# Patient Record
Sex: Female | Born: 1965
Health system: Southern US, Community
[De-identification: ages and names within clinical notes are randomized; demographics above are authoritative.]

## PROBLEM LIST (undated history)

## (undated) DIAGNOSIS — L309 Dermatitis, unspecified: Secondary | ICD-10-CM

## (undated) DIAGNOSIS — I10 Essential (primary) hypertension: Secondary | ICD-10-CM

## (undated) DIAGNOSIS — E0590 Thyrotoxicosis, unspecified without thyrotoxic crisis or storm: Secondary | ICD-10-CM

## (undated) HISTORY — DX: Essential (primary) hypertension: I10

## (undated) HISTORY — DX: Thyrotoxicosis, unspecified without thyrotoxic crisis or storm: E05.90

## (undated) HISTORY — PX: CARDIAC CATHETERIZATION: SHX172

## (undated) HISTORY — DX: Dermatitis, unspecified: L30.9

---

## 1997-09-12 ENCOUNTER — Encounter: Admission: RE | Admit: 1997-09-12 | Discharge: 1997-09-12 | Payer: Self-pay | Admitting: Family Medicine

## 1998-02-08 ENCOUNTER — Encounter: Admission: RE | Admit: 1998-02-08 | Discharge: 1998-02-08 | Payer: Self-pay | Admitting: Family Medicine

## 1999-04-08 ENCOUNTER — Emergency Department (HOSPITAL_COMMUNITY): Admission: EM | Admit: 1999-04-08 | Discharge: 1999-04-08 | Payer: Self-pay | Admitting: Emergency Medicine

## 1999-04-08 ENCOUNTER — Encounter: Payer: Self-pay | Admitting: Emergency Medicine

## 1999-05-02 ENCOUNTER — Encounter: Admission: RE | Admit: 1999-05-02 | Discharge: 1999-05-02 | Payer: Self-pay | Admitting: Family Medicine

## 1999-08-31 ENCOUNTER — Encounter: Payer: Self-pay | Admitting: Emergency Medicine

## 1999-08-31 ENCOUNTER — Emergency Department (HOSPITAL_COMMUNITY): Admission: EM | Admit: 1999-08-31 | Discharge: 1999-08-31 | Payer: Self-pay | Admitting: Emergency Medicine

## 2001-02-04 ENCOUNTER — Emergency Department (HOSPITAL_COMMUNITY): Admission: EM | Admit: 2001-02-04 | Discharge: 2001-02-04 | Payer: Self-pay | Admitting: Emergency Medicine

## 2001-06-17 ENCOUNTER — Emergency Department (HOSPITAL_COMMUNITY): Admission: AC | Admit: 2001-06-17 | Discharge: 2001-06-17 | Payer: Self-pay

## 2001-06-17 ENCOUNTER — Encounter: Payer: Self-pay | Admitting: *Deleted

## 2001-09-16 ENCOUNTER — Encounter: Admission: RE | Admit: 2001-09-16 | Discharge: 2001-09-16 | Payer: Self-pay | Admitting: Family Medicine

## 2001-09-30 ENCOUNTER — Encounter: Admission: RE | Admit: 2001-09-30 | Discharge: 2001-09-30 | Payer: Self-pay | Admitting: Family Medicine

## 2002-05-31 ENCOUNTER — Encounter: Admission: RE | Admit: 2002-05-31 | Discharge: 2002-05-31 | Payer: Self-pay | Admitting: Sports Medicine

## 2003-03-02 ENCOUNTER — Encounter: Admission: RE | Admit: 2003-03-02 | Discharge: 2003-03-02 | Payer: Self-pay | Admitting: Family Medicine

## 2004-04-10 ENCOUNTER — Ambulatory Visit: Payer: Self-pay | Admitting: Family Medicine

## 2004-04-18 ENCOUNTER — Emergency Department (HOSPITAL_COMMUNITY): Admission: EM | Admit: 2004-04-18 | Discharge: 2004-04-18 | Payer: Self-pay | Admitting: *Deleted

## 2005-01-02 ENCOUNTER — Inpatient Hospital Stay (HOSPITAL_COMMUNITY): Admission: EM | Admit: 2005-01-02 | Discharge: 2005-01-10 | Payer: Self-pay | Admitting: Emergency Medicine

## 2005-01-02 ENCOUNTER — Ambulatory Visit: Payer: Self-pay | Admitting: Family Medicine

## 2005-01-02 ENCOUNTER — Ambulatory Visit: Payer: Self-pay | Admitting: Cardiology

## 2005-01-04 ENCOUNTER — Ambulatory Visit: Payer: Self-pay | Admitting: Internal Medicine

## 2005-01-24 ENCOUNTER — Ambulatory Visit: Payer: Self-pay | Admitting: Sports Medicine

## 2005-01-25 ENCOUNTER — Ambulatory Visit: Payer: Self-pay | Admitting: Internal Medicine

## 2006-07-31 DIAGNOSIS — L309 Dermatitis, unspecified: Secondary | ICD-10-CM | POA: Insufficient documentation

## 2006-07-31 DIAGNOSIS — E0590 Thyrotoxicosis, unspecified without thyrotoxic crisis or storm: Secondary | ICD-10-CM | POA: Insufficient documentation

## 2006-07-31 DIAGNOSIS — I10 Essential (primary) hypertension: Secondary | ICD-10-CM | POA: Insufficient documentation

## 2006-07-31 DIAGNOSIS — F17200 Nicotine dependence, unspecified, uncomplicated: Secondary | ICD-10-CM

## 2007-07-30 ENCOUNTER — Encounter: Payer: Self-pay | Admitting: Family Medicine

## 2007-07-30 ENCOUNTER — Ambulatory Visit: Payer: Self-pay | Admitting: Family Medicine

## 2007-07-30 DIAGNOSIS — N912 Amenorrhea, unspecified: Secondary | ICD-10-CM | POA: Insufficient documentation

## 2007-07-30 LAB — CONVERTED CEMR LAB
BUN: 15 mg/dL (ref 6–23)
Beta hcg, urine, semiquantitative: NEGATIVE
CO2: 21 meq/L (ref 19–32)
Calcium: 8.9 mg/dL (ref 8.4–10.5)
Chlamydia, DNA Probe: NEGATIVE
Chloride: 108 meq/L (ref 96–112)
Creatinine, Ser: 0.7 mg/dL (ref 0.40–1.20)
GC Probe Amp, Genital: NEGATIVE
Glucose, Bld: 94 mg/dL (ref 70–99)
Potassium: 4.4 meq/L (ref 3.5–5.3)
Sodium: 141 meq/L (ref 135–145)
TSH: 0.018 microintl units/mL — ABNORMAL LOW (ref 0.350–5.50)

## 2007-08-04 ENCOUNTER — Encounter: Payer: Self-pay | Admitting: Family Medicine

## 2007-08-06 ENCOUNTER — Telehealth: Payer: Self-pay | Admitting: Family Medicine

## 2008-01-18 ENCOUNTER — Encounter (INDEPENDENT_AMBULATORY_CARE_PROVIDER_SITE_OTHER): Payer: Self-pay | Admitting: Family Medicine

## 2008-01-18 ENCOUNTER — Encounter: Payer: Self-pay | Admitting: *Deleted

## 2008-01-18 ENCOUNTER — Ambulatory Visit: Payer: Self-pay | Admitting: Family Medicine

## 2008-03-04 ENCOUNTER — Telehealth: Payer: Self-pay | Admitting: *Deleted

## 2008-12-09 ENCOUNTER — Ambulatory Visit: Payer: Self-pay | Admitting: Family Medicine

## 2010-06-24 ENCOUNTER — Encounter: Payer: Self-pay | Admitting: Family Medicine

## 2010-06-24 ENCOUNTER — Encounter: Payer: Self-pay | Admitting: Internal Medicine

## 2010-07-13 ENCOUNTER — Telehealth: Payer: Self-pay | Admitting: Family Medicine

## 2010-07-13 NOTE — Telephone Encounter (Signed)
Received a refill request for Propylthiouracl and Metoprol.  Both denied - pt needs to make an appt.

## 2010-07-13 NOTE — Telephone Encounter (Signed)
Spoke with Candice Evans, pt was informed to call the pharmacy and ask them to send a refill request.  Pt just wants the let the MD know that she understands that she has not been here in a while, she just wants the MD to give her a weeks medicine when he receives the refill request.  Will forward to MD and Myrlene Broker, RN who does intern refills.

## 2010-07-16 ENCOUNTER — Telehealth: Payer: Self-pay | Admitting: Family Medicine

## 2010-07-17 NOTE — Telephone Encounter (Signed)
resolved 

## 2010-07-17 NOTE — Telephone Encounter (Signed)
Message left for patient to call back. It appears that she has had no refills on propylthiouricil  or metorprol since 12/2009. consulted Dr. Leveda Anna  And he advises if she has been off propylthiouracil for a while it will be best to wait until office visit and check labs before restarting. May send in metoprolol.  Will discuss with patient when she calls back.

## 2010-07-17 NOTE — Telephone Encounter (Signed)
Will refill for two weeks, pt has appointment in a few days.  Needs to keep appointment before given full refills.

## 2010-07-19 NOTE — Telephone Encounter (Signed)
Spoke with patient . She had not recieved any messages that I had left for her.  She has appointment tomorrow with MD. She has been out of both meds for about 6 months. Advised I can send in metoprolol but she states she will wait until  tomorrow.

## 2010-07-20 ENCOUNTER — Other Ambulatory Visit: Payer: Self-pay | Admitting: Family Medicine

## 2010-07-20 ENCOUNTER — Ambulatory Visit (INDEPENDENT_AMBULATORY_CARE_PROVIDER_SITE_OTHER): Payer: Self-pay | Admitting: Family Medicine

## 2010-07-20 ENCOUNTER — Encounter: Payer: Self-pay | Admitting: Family Medicine

## 2010-07-20 VITALS — BP 210/80 | HR 84 | Wt 150.9 lb

## 2010-07-20 DIAGNOSIS — I10 Essential (primary) hypertension: Secondary | ICD-10-CM

## 2010-07-20 DIAGNOSIS — L2089 Other atopic dermatitis: Secondary | ICD-10-CM

## 2010-07-20 DIAGNOSIS — E0590 Thyrotoxicosis, unspecified without thyrotoxic crisis or storm: Secondary | ICD-10-CM

## 2010-07-20 LAB — COMPREHENSIVE METABOLIC PANEL
Sodium: 137 mEq/L (ref 135–145)
Total Bilirubin: 0.2 mg/dL — ABNORMAL LOW (ref 0.3–1.2)

## 2010-07-20 LAB — CBC
Hemoglobin: 13.7 g/dL (ref 12.0–15.0)
MCH: 32.2 pg (ref 26.0–34.0)
MCV: 92.3 fL (ref 78.0–100.0)
Platelets: 259 10*3/uL (ref 150–400)
RBC: 4.26 MIL/uL (ref 3.87–5.11)
WBC: 9.8 10*3/uL (ref 4.0–10.5)

## 2010-07-20 LAB — TSH: TSH: 0.008 u[IU]/mL — ABNORMAL LOW (ref 0.350–4.500)

## 2010-07-20 MED ORDER — METOPROLOL TARTRATE 25 MG PO TABS: 25.0000 mg | ORAL_TABLET | Freq: Two times a day (BID) | ORAL | Status: DC

## 2010-07-20 MED ORDER — TRIAMCINOLONE ACETONIDE 0.1 % EX CREA: TOPICAL_CREAM | Freq: Two times a day (BID) | CUTANEOUS | Status: DC

## 2010-07-20 MED ORDER — MOMETASONE FUROATE 0.1 % EX OINT: TOPICAL_OINTMENT | Freq: Every day | CUTANEOUS | Status: DC

## 2010-07-20 NOTE — Patient Instructions (Signed)
It was nice meeting you today.  I would like to see you back next week so that we can go over your lab work and come up with a plan of action based on your results.  I also would like to follow-up on your blood pressure at that time.  I will send over a prescription for your metoprolol for now to [facility].

## 2010-07-20 NOTE — Progress Notes (Signed)
  Subjective:    Patient ID: Candice Evans, female    DOB: 1966/03/19, 45 y.o.   MRN: [id number]  HPI Patient here for medication refill.  Has been >1 year since being seen in this clinic.   HTN Has been out of medications x6 months.  Has not really noticed that her blood pressure has been elevated.  Does not check at home. Still smoking 0.5-0.75 packs of cigs per day.  Denies any red flag symptoms including headache, dizziness, blurred vision  Hyperthyroidism Has been out of PTU x 6 months as well.  Has been symptomatic with this.  States she has occasional neck pain, intolerance to heat, difficulty with sleep, increased eye bulging, increased fatigue feeling, and occasional palitations.  She denies diarrhea or difficulty with swallowing.  States she has had work-up for her hyperthyroidism in the past and was considered for radioactive iodine, but it was decided at that time she would go on PTU  Review of Systems Pertinent positives and negatives noted in HPI, Vitals signs noted      Objective:   Physical Exam  Constitutional: She appears well-developed and well-nourished. No distress.  HENT:  Head: Normocephalic and atraumatic.  Eyes: Pupils are equal, round, and reactive to light.  Neck: Thyromegaly present.       Fullness on R side of thyroid  Cardiovascular: Normal rate and regular rhythm.  Exam reveals no gallop and no friction rub.   No murmur heard. Pulmonary/Chest: Breath sounds normal.  Abdominal: Bowel sounds are normal. She exhibits no distension. There is no tenderness.  Musculoskeletal: She exhibits edema.       1+ edema   Neurological:  Reflex Scores:      Bicep reflexes are 3+ on the right side and 3+ on the left side.      Patellar reflexes are 3+ on the right side and 3+ on the left side.

## 2010-07-25 ENCOUNTER — Encounter: Payer: Self-pay | Admitting: Family Medicine

## 2010-07-25 NOTE — Assessment & Plan Note (Addendum)
Out of medications x6 months, will check TSH today to see if she is still truly hyperthyroid.  Will have back in 1 week to go over labs and come up with treatment plan.  Precepted with Dr. Leveda Anna and Moreno-Coll

## 2010-07-25 NOTE — Assessment & Plan Note (Signed)
BP very elevated today, will restart Beta blocker and see her response to this.  Do not want to drop her bp too fast as she has probably been elevated for quite some time.  WIll add other agent back on as needed on return visit.  Return in 1 week for follow-up

## 2010-07-30 ENCOUNTER — Telehealth: Payer: Self-pay | Admitting: Family Medicine

## 2010-07-30 NOTE — Telephone Encounter (Signed)
Pt has been waiting on refill for bp meds, is having blurred vision. Will route to triage.

## 2010-07-30 NOTE — Telephone Encounter (Signed)
Discussed labs. Needs to be seen asap. She had not made a 1 wk f/u appt with pcp because "he did not have anything open" told her to call & make appt. May see anyone. Thyroid is very low & needs bp check asap.Faustino Congress

## 2010-07-31 ENCOUNTER — Ambulatory Visit (INDEPENDENT_AMBULATORY_CARE_PROVIDER_SITE_OTHER): Payer: Self-pay | Admitting: Family Medicine

## 2010-07-31 ENCOUNTER — Encounter: Payer: Self-pay | Admitting: Family Medicine

## 2010-07-31 VITALS — BP 183/89 | HR 79 | Wt 149.0 lb

## 2010-07-31 DIAGNOSIS — L2089 Other atopic dermatitis: Secondary | ICD-10-CM

## 2010-07-31 DIAGNOSIS — E0590 Thyrotoxicosis, unspecified without thyrotoxic crisis or storm: Secondary | ICD-10-CM

## 2010-07-31 DIAGNOSIS — I10 Essential (primary) hypertension: Secondary | ICD-10-CM

## 2010-07-31 DIAGNOSIS — H539 Unspecified visual disturbance: Secondary | ICD-10-CM

## 2010-07-31 MED ORDER — MOMETASONE FUROATE 0.1 % EX OINT: TOPICAL_OINTMENT | Freq: Every day | CUTANEOUS | Status: DC

## 2010-07-31 MED ORDER — METOPROLOL TARTRATE 25 MG PO TABS: 50.0000 mg | ORAL_TABLET | Freq: Two times a day (BID) | ORAL | Status: DC

## 2010-07-31 MED ORDER — PROPYLTHIOURACIL 50 MG PO TABS: ORAL_TABLET | ORAL | Status: DC

## 2010-07-31 MED ORDER — TRIAMCINOLONE ACETONIDE 0.1 % EX CREA: TOPICAL_CREAM | Freq: Two times a day (BID) | CUTANEOUS | Status: DC

## 2010-07-31 NOTE — Patient Instructions (Addendum)
Increase your Metoprolol to 50mg  by mouth twice a day Start the PTU you will take 150mg  twice a day You have been given the [facility] handout Return for labs in 4 weeks Make a follow-up appt with Dr. Ashley Royalty in 4 weeks

## 2010-07-31 NOTE — Assessment & Plan Note (Signed)
Chronic vision changes, should be evaluated by Optho but pt unable to secondary to no insurance No HA currently and no papilledema seen, though proptosis secondary to thyroid disease and needs to be evaluated HTN could also be causing her vision changes.

## 2010-07-31 NOTE — Progress Notes (Signed)
  Subjective:    Patient ID: Candice Evans, female    DOB: [birth year], 45 y.o.   MRN: [id number]  HPI  Here to follow up her labs- reviewed   Vision changes- has had vision changes for many years, sees spots, light changes, swirls    occ has blurry vision, HA occ when this occurs in frontal region, typically resolves without OTC meds, feels this occurs when her blood pressure is high,  no menstrual cycle   Currently taking Metoprolol only for HTN,  Note pt is uninsured- reason why she does not stay on her medication is because she can not afford it, also states she has not had an u/s of the neck and declined radioactive treatment when initially diagnosed   Review of Systems per HPI     Objective:   Physical Exam GEN- NAD, alert and oriented HEENT- proptosis, fundoscopic exam - no papilledema, vessels visualized, PERRL, EOMI Neck- Thyromegaly, RUL nodules, non tender CVS- RRR, no murmur HR 80, pulse 2+ EXT-no edema       Assessment & Plan:

## 2010-07-31 NOTE — Assessment & Plan Note (Signed)
Increase Metoprolol to 50mg  BID Recheck next visit

## 2010-07-31 NOTE — Assessment & Plan Note (Signed)
Discussed symptoms of hyperthryoid and importance of regular follow-up with pt Will restart PTU ( 300mg  total- titrate as needed ) as pt unable to afford Tapazole, will need an u/s of thyroid and radiouptake scan, pt wants to defer until she gets some insurance coverage RTC 4 weeks for repeat labs T3,T4, TSH Beta blocker

## 2010-08-27 ENCOUNTER — Other Ambulatory Visit: Payer: Self-pay

## 2010-08-29 ENCOUNTER — Ambulatory Visit (INDEPENDENT_AMBULATORY_CARE_PROVIDER_SITE_OTHER): Payer: Self-pay | Admitting: Family Medicine

## 2010-08-29 ENCOUNTER — Encounter: Payer: Self-pay | Admitting: Family Medicine

## 2010-08-29 VITALS — BP 170/91 | HR 62 | Temp 98.4°F | Ht 62.0 in | Wt 152.0 lb

## 2010-08-29 DIAGNOSIS — I10 Essential (primary) hypertension: Secondary | ICD-10-CM

## 2010-08-29 DIAGNOSIS — L2089 Other atopic dermatitis: Secondary | ICD-10-CM

## 2010-08-29 DIAGNOSIS — E0590 Thyrotoxicosis, unspecified without thyrotoxic crisis or storm: Secondary | ICD-10-CM

## 2010-08-29 MED ORDER — PROPYLTHIOURACIL 50 MG PO TABS: ORAL_TABLET | ORAL | Status: DC

## 2010-08-29 MED ORDER — TRIAMCINOLONE ACETONIDE 0.1 % EX CREA: TOPICAL_CREAM | Freq: Two times a day (BID) | CUTANEOUS | Status: DC

## 2010-08-29 MED ORDER — HYDROCHLOROTHIAZIDE 12.5 MG PO CAPS: 12.5000 mg | ORAL_CAPSULE | Freq: Every day | ORAL | Status: DC

## 2010-08-29 NOTE — Patient Instructions (Signed)
It was nice seeing you today.  I will call you with results of your labs and let you know if we need to adjust your medications.  I would like to see you back in 2-3 weeks to see how your blood pressure is doing on the new medication.  If you have questions please call our office.

## 2010-08-30 LAB — T4, FREE: Free T4: 0.99 ng/dL (ref 0.80–1.80)

## 2010-09-03 ENCOUNTER — Telehealth: Payer: Self-pay | Admitting: Family Medicine

## 2010-09-03 NOTE — Telephone Encounter (Signed)
Called patient to let her know results of latest thyroid tests.  Feels good.  Plans to follow up in a couple weeks.  Working on getting orange card so can have thyroid scan/us

## 2010-09-04 NOTE — Assessment & Plan Note (Addendum)
Discussed symptoms of hyperthryoid and importance of regular follow-up  Continue PTU at current dose as symptoms resolved at this time.  Repeat labs T3,T4, TSH Cont. Metoprolol Still in the process of getting orange card, will get thyroid uptake scan and U/S once she has this.

## 2010-09-04 NOTE — Assessment & Plan Note (Signed)
Has flares occasionally.  Currently no flare, but out of medication.  WIll refill triamcinolone.

## 2010-09-04 NOTE — Progress Notes (Signed)
  Subjective:    Patient ID: Candice Evans, female    DOB: 22-Jun-1965, 45 y.o.   MRN: [id number]  HPI Here to follow up on 1.Hyperthyroidism:  Currently taking PTU, tolerating well.  No further symptoms since increase in medication.  Denies palpitations, heat intolerance, chest pain, diarrhea.  Knows she need thyroid U/S, in the process of getting project access orange card.   2.HTN:  BP still elevated.  States she is compliant with Metoprolol.  Still smoking.  Denies headache, n/v, chest pain, sob 3.Eczema:  Has occasional flares of eczema, given triamcinolone in the past for this, states that this works well.  Would like refill on triamcinolone.     Review of Systems See HPI    Objective:   Physical Exam  Constitutional: She appears well-developed and well-nourished. No distress.  HENT:  Head: Normocephalic and atraumatic.  Eyes:       Mild proptosis  Neck: Thyromegaly present.       Thyroid non tender   Cardiovascular: Normal rate, regular rhythm and normal heart sounds.   Pulmonary/Chest: Effort normal and breath sounds normal.  Abdominal: Soft. Bowel sounds are normal.  Neurological: She displays normal reflexes.  Skin: Skin is warm and dry.  Psychiatric: She has a normal mood and affect. Her behavior is normal. Judgment and thought content normal.          Assessment & Plan:  SEE A&P in Problem list

## 2010-09-04 NOTE — Assessment & Plan Note (Addendum)
BP still elevated today.  Taking metroprolol, HR (60's)  too low to increase at this time.  Will add on HCTZ and have her return in 2 weeks for bp re-check.

## 2010-10-19 NOTE — Cardiovascular Report (Signed)
NAMEPAETON, LATOUCHE                ACCOUNT NO.:  [id number]   MEDICAL RECORD NO.:  [id number]          PATIENT TYPE:  INP   LOCATION:  2027                         FACILITY:  [facility]   PHYSICIAN:  Arturo Morton. Riley Kill, M.D. [facility] BIRTH:  1965-10-29   DATE OF PROCEDURE:  01/09/2005  DATE OF DISCHARGE:                              CARDIAC CATHETERIZATION   INDICATIONS:  Ms. Gilvin is a 45 year old who presented to [facility]  with positive cardiac enzymes. She was subsequently found to be  significantly hyperthyroid. She also had been recently using cocaine and was  a smoker.  She has been treated with intravenous heparin for the last  several days, at the same time being treated to cool down the thyroid.  She  was seen in consultation by Dr. __________ Barry Dienes, and we were worried about  the proximal left anterior descending artery. She was brought back to the  catheterization laboratory to re-angioplasty the proximal LAD. We thought  that the patient could initially had spasm in this location. Repeat  angiography was indicated with limited contrast.   PROCEDURE:  Coronary arteriography of the left coronary artery.   DESCRIPTION OF PROCEDURE:  The patient was brought to the catheterization  laboratory after informed consent. She was prepped and draped in usual  fashion.  Through an anterior puncture, the right femoral artery was easily  entered, 6-French sheath was placed. We used a 6-French JL-3-5 guiding  catheter to best visualize the LAD. After multiple views taken in multiple  angiographic projections, I brought Dr. Samule Ohm into the laboratory review  the findings with me. It was a consensus opinion to avoid placing an  intravascular ultrasound catheter based upon the appearance of the LAD and  it was felt that she should be treated medically at the present time. She  was taken to the holding area in satisfactory clinical condition.  Angiographic data of left main is free of  critical disease.   The left anterior descending artery courses to the apex.  The LAD itself  still demonstrates mild eccentric plaquing in the proximal vessel best seen  on the RAO caudal view; however, in multiple views, there does not appear to  be a critical stenosis at this site with less than 50% narrowing in any  particular view. Runoff into the mid and distal LAD is excellent. The  circumflex is a large caliber vessel without critical stenosis.   CONCLUSION:  Improved appearance of the proximal left anterior descending  artery.   DISPOSITION:  At the present time the patient will remain on aspirin. She  will be treated for marked hyperthyroidism and will remain on beta-blockers  as well as calcium channel antagonists, discontinuation of smoking, and  absolute avoidance of cocaine is recommended.  She will need close follow-up  in the family practice teaching clinic.       TDS/MEDQ  D:  01/09/2005  T:  01/09/2005  Job:  [id number]   cc:   [facility] Laboratory   Conni Elliot, M.D.  Fax: [phone]

## 2010-10-19 NOTE — Discharge Summary (Signed)
Candice Evans, Candice Evans                ACCOUNT NO.:  [id number]   MEDICAL RECORD NO.:  [id number]          PATIENT TYPE:  INP   LOCATION:  2027                         FACILITY:  [facility]   PHYSICIAN:  Duke Salvia, M.D.  DATE OF BIRTH:  September 21, 1965   DATE OF ADMISSION:  01/01/2005  DATE OF DISCHARGE:  01/10/2005                                 DISCHARGE SUMMARY   PROCEDURES:  1.  Cardiac catheterization.  2.  Coronary arteriogram.  3.  Left ventriculogram.   DISCHARGE DIAGNOSES:  1.  Chest pain, peak troponin I 0.17 with normal CK-MBs and status post      cardiac catheterization January 02, 2005, showing a 50 to 70% left      anterior descending and 30% right coronary artery that decreased to 0%      after intracoronary nitroglycerin.  2.  Preserved left ventricular function with a normal ejection fraction at      catheterization.  3.  Hypothyroidism.  4.  History of drug abuse including cocaine, marijuana, and tobacco.  5.  Hypertension.  6.  Right groin bruit with negative ultrasound, small hematoma noted.   HOSPITAL COURSE:  Candice Evans is a 45 year old African-American female with  cardiac risk factors of hypertension and strong family history of coronary  artery disease and ongoing polysubstance abuse.  She had chest pain and came  to the emergency room where she was admitted for further evaluation and  treatment.   She was catheterized on January 02, 2005, with the results described above.  She was evaluated by Dr. Riley Kill as well as Dr. Cornelius Moras.  Dr. Cornelius Moras discussed  the situation with Candice Evans, but she was adamant at this point that,  regardless of our recommendations, she would refuse surgical  revascularization.   After her refusal of surgery, she underwent repeat catheterization and had  less than 50% LAD stenosis at the ostium with excellent TIMI-3 flow.  There  was no indication of vessel disruption, and no percutaneous intervention was  indicated.   As part of our  evaluation, a TSH was checked, and it was very low at less  than 0.004.  An internal medicine consult was called, and she was diagnosed  with Graves disease.  She was started on PTU and will continue this at  discharge with followup as an outpatient.  Her thyroid peroxidase antibody  was minimally elevated at 65.4, and her ANA was negative.  Free T4 was  elevated at 3.82.  Free T3 was elevated at 10.3.   Candice Evans had a drug screen on admission that was positive for cocaine and  cannabinoids.  She also has a history of tobacco abuse.  She was seen by  care management and social workers, and the importance of quitting and  following a healthier lifestyle was emphasized to her.  She had a followup  with family practice teaching service prior to discharge.   Candice Evans had not been on any medications for hypertension control prior to  admission.  At first, beta blockers were avoided, but then it was felt, if  she  would stay off drugs, she could tolerate a beta blocker which would be  beneficial to her cardiac status.  She was on Cardizem CD 120 and Toprol XL  50 at discharge.   Candice Evans had a right groin bruit after the catheterization, but an  ultrasound showed no evidence of pseudoaneurysm or A-V fistula.  She did  have a small hematoma.   By January 10, 2005, Candice Evans was ambulating without chest pain or  shortness of breath.  She had a lipid profile checked which showed a total  cholesterol of 132, triglycerides 47, HDL 61, LDL 62.  A urine culture also  had been performed and was negative.  Hemoglobin A1c was within normal  limits as well.  She was seen by care management and social work and given  information on outpatient programs.  She was considered stable for discharge  on January 10, 2005, with outpatient followup arranged.   DISCHARGE INSTRUCTIONS:  1.  Activity level was to be increased slowly.  2.  She is to call the office for problems with the catheterization site.   3.  She is to see the P.A. for Dr. Riley Kill on August 25 at 1:45 and follow      up at [facility] on August 24 at 11 a.m.   DISCHARGE MEDICATIONS:  1.  Aspirin 325 mg p.o. daily.  2.  Cardizem CD 120 daily.  3.  Toprol XL 50 mg daily.  4.  Wellbutrin SR 150 mg 1 tablet daily for 3 days, then 1 tablet twice      daily.  5.  PTU 100 mg 3 times a day.      Theodore Demark, P.A. [facility]    ______________________________  Duke Salvia, M.D.    RB/MEDQ  D:  03/06/2005  T:  03/06/2005  Job:  [id number]   cc:   [facility]   Arturo Morton. Riley Kill, M.D. [facility]  [address]  [address]  [place]  [place] [address]

## 2010-10-19 NOTE — Consult Note (Signed)
NAMEFEDERICA, Evans                ACCOUNT NO.:  [id number]   MEDICAL RECORD NO.:  [id number]          PATIENT TYPE:  INP   LOCATION:  2027                         FACILITY:  [facility]   PHYSICIAN:  Bonnita Hollow, M.D.DATE OF BIRTH:  10-23-1965   DATE OF CONSULTATION:  01/03/2005  DATE OF DISCHARGE:  01/10/2005                                   CONSULTATION   HISTORY OF PRESENT ILLNESS:  Ms. Candice Evans is a 45 year old female who  presented to the emergency department on January 01, 2005 with chest pain,  shortness of breath, and diaphoresis associated with left arm pain. The  patient is noted to have ST segment elevation in V1 to V3, as well as  borderline positive troponins. She was admitted by cardiology and taken to  catheterization lab on January 02, 2005, which was significant for  questionable left anterior descending lesion versus spasm. Of note, the  patient had been using cocaine, nicotine and marijuana at least 1 to 3 days  prior to admission. [facility] was called for consultation  due to increased pH of less than 0.004.   PAST MEDICAL HISTORY:  G2 female with no known medical problems. Status post  cardiac catheterization on January 02, 2005.   MEDICATIONS:  No medications prior to admission.   ADMISSION MEDICATIONS:  ASA 325 mg p.o. q. day, Lorazepam 0.5 mg  intravenously q. 12 hours, Diltiazem 30 mg p.o. q. 6 hours, nitroglycerin 60  mg, heparin 5,000 q. 1 or 2 hours.   ALLERGIES:  No known drug allergies.   SOCIAL HISTORY:  The patient is a single G2 female __________. She is an  avid tobacco smoker of approximately 1 to 2 packs a day. Occasional  marijuana use. The patient states that she only started using cocaine  immediately prior to admission date.   FAMILY HISTORY:  Mother living with hypertension. Father living with  hypertension. Siblings all described as healthy. Maternal grandfather with  heart problems. No history of thyroid disease.  History of heart problems  less than 69 years of age on father's side of family.   PHYSICAL EXAMINATION:  VITAL SIGNS:  Temperature 98, heart rate 85,  respiratory rate 20, blood pressure 130/55. O2 saturation 95% on room air.  GENERAL:  Alert and oriented female. Well developed, no apparent distress.  Pleasant, cooperative with examination and history.  HEENT:  Pupils are equal, round, and reactive to light. Extraocular muscles  intact.  NECK:  Supple. Full range of motion. Very mild thyromegaly. No nodules  palpated. Throat mandibular lymphadenopathy bilaterally. No significant  proptosis. Oral mucosa moist. No exudate appreciated at posterior pharynx.  CARDIOVASCULAR:  Regular rate and rhythm. Mild 2 out of 6 systolic murmur.  LUNGS:  Clear to auscultation bilaterally.  ABDOMEN:  Soft, nontender, and nondistended. Positive bowel sounds. No  hepatosplenomegaly. No masses appreciated.  EXTREMITIES:  No edema of lower extremities. No lesions appreciated.  NEUROLOGIC:  Cranial nerves II-XII are grossly intact with 2+ reflexes.   REVIEW OF SYSTEMS:  Denies excessive headache. Complains of very mild  unintentional weight loss, although  she contributes it to her new job. She  says that she has lost about 25 pounds over 1 year. She does complain of  occasional mild nausea with vomiting.   LABORATORY DATA:  Sodium 135, potassium 3.4, chloride 101, bicarb 25, BUN 5,  creatinine 0.4, glucose 119. White blood cell count 9.5. Hemoglobin and  hematocrit 11.4 and 34.2. Platelets 202,000. MCV 87.4, TSH less than 0.004  on January 02, 2005. Urine drug screen significant for cocaine and marijuana.   ASSESSMENT/PLAN:  A 45 year old female with non-specific lesion in left  anterior descending and significantly decreased TSH.   PROBLEM LIST:  1.  CARDIOVASCULAR:  Appreciate cardiac teams management of patient. Will      await results of possible ultrasound to determine or define left      anterior  descending lesion versus spasm as the cause of EKG in      catheterization findings. The patient's chest pain well controlled at      present.  2.  SIGNIFICANTLY DECREASED TSH:  Physical examination and history benign.      Will repeat TSH as well as order Free T3 and T4 to help determine if      this represents a true hypothyroidism or a subclinical picture. If      aforementioned labs abnormal, will consider thyroid and otherwise will      monitor as outpatient. No medical regimen needs to be initiated for now,      considering heart rate and blood pressure are well controlled.  3.  DRUG ABUSE:  Nicotine and cocaine and marijuana significant history.      Social work has been consulted to give counseling.   DISPOSITION:  Awaiting cardiac stabilization. Will be happy to follow  patient as inpatient and also when discharged as an outpatient. But until  the, [facility] Teaching Service will follow care of patient to follow  thyroid labs.   Thank you.      Bonnita Hollow, M.D.     VRE/MEDQ  D:  01/17/2005  T:  01/17/2005  Job:  [id number]

## 2010-10-19 NOTE — Cardiovascular Report (Signed)
NAME:  Candice Evans, Candice Evans                ACCOUNT NO.:  [id number]   MEDICAL RECORD NO.:  [id number]          PATIENT TYPE:  INP   LOCATION:  6524                         FACILITY:  [facility]   PHYSICIAN:  Arturo Morton. Riley Kill, M.D. [facility] BIRTH:  02-14-1966   DATE OF PROCEDURE:  01/02/2005  DATE OF DISCHARGE:                              CARDIAC CATHETERIZATION   INDICATIONS:  Candice Evans is a 45 year old woman who presents with chest pain  and anterior T-wave changes. She used cocaine on Sunday and also is a user  of marijuana and nicotine. She was brought to the catheterization laboratory  for further evaluation and treatment.   PROCEDURES:  1.  Left heart catheterization.  2.  Selective coronary arteriography.  3.  Selective left ventriculography.  4.  Intracoronary nitroglycerin administration.   DESCRIPTION OF PROCEDURE:  The patient was brought to the cath lab and  prepped and draped in the usual fashion. We initially used a 4-French  device. Following informed consent, the femoral sheath was placed. A 4-  French left diagnostic catheter and central aortic and left ventricular  pressures were measured with a pigtail. Ventriculography was performed in  the RAO projection. We then used a 4-French catheter to inject the left  coronary, but we could not get complete opacification. We, therefore,  changed over. We did a picture of the right coronary artery. There was some  spasm that appeared to be catheter induced and after the administration of  intracoronary nitroglycerin a proximal area of narrowing of 30% was reduced  to essentially 0% residual luminal narrowing. The views of the left coronary  artery were obtained and I reviewed the films with Dr. Samule Ohm and then I  elected to use a 6-French guiding catheter to get better views of the  proximal LAD. Intracoronary nitroglycerin was administered in the left  coronary as well. The patient did develop some mild tachycardia and some  chest pain and was given intravenous metoprolol 2.5 milligrams x2 with some  relief of some of her symptoms. She was eventually taken to the holding area  in satisfactory clinical condition. Dr. Samule Ohm and I reviewed the films. I  then called Dr. Cornelius Moras to review the films as well for possible  revascularization surgery.   I discussed the case with the patient's mother and the patient. She was to  be transferred 6500 for further evaluation.   HEMODYNAMIC DATA:  1.  Central aortic pressure 177/87, mean 123.  2.  Left ventricular pressure 188/22.  3.  No gradient on pullback across the aortic valve.   ANGIOGRAPHIC DATA:  1.  The left main itself appears to be free of significant disease.  2.  The left anterior descending artery at the ostium and the proximal      vessel demonstrates some haziness. This varies in size depending upon      the view in the image #22. There is what appears to be about 60%      proximal narrowing. Whether this represents spasm or fixed disease is      difficult to be certain. After the  administration of intracoronary      nitroglycerin, it changed perhaps slightly. There was also a slight      haziness noted. The mid and distal LAD was without critical narrowing.  3.  The circumflex was large caliber vessel that appeared to be free of      critical disease.  4.  The right coronary artery had about 30% narrowing proximally which was      relieved by intracoronary nitroglycerin. The remainder of the vessels      were without critical narrowing.  5.  Ventriculography in the RAO projection revealed vigorous global systolic      function. No segmental abnormalities or contraction were identified.   DISCUSSION:  I have called Dr. Cornelius Moras to review the films. Importantly, the  patient has had cocaine exposure three days from the time of the  catheterization. There is clearly a lesion in the proximal left anterior  descending artery, and this was manifested on EKG as  some significant T-wave  inversion. Whether or not this was spasm-induced or not is not entirely  clear to me. Importantly, there is no contrast hangup. We discussed the  various options. One option would be to do an intravascular ultrasound to  identify whether the proximal area represents fixed disease or not. We plan  to potentially wait several days in order to do this. This could also  clearly represent fixed disease and I have asked Dr. Cornelius Moras to review the  films. We talked about the various time frame scenarios for restudying this  lady, and we want to try to wait until cocaine is completely cleared from  the system. Unfortunately, there is not a good landing zone proximally for a  stent should this be significant fixed disease with plaque rupture. She will  be maintained on intracoronary, intravenous nitroglycerin and heparin over  the next couple of days with gradual titration of medicines predominantly to  involve calcium channel blockade and nitrates. The patient understands her  need to try to avoid vasoconstrictors. This been discussed in detail.       TDS/MEDQ  D:  01/02/2005  T:  01/03/2005  Job:  [id number]   cc:   Duke Salvia, M.D.   [facility]   Patient's medical record

## 2010-10-19 NOTE — Consult Note (Signed)
NAME:  Candice Evans, Candice Evans                ACCOUNT NO.:  [id number]   MEDICAL RECORD NO.:  [id number]          PATIENT TYPE:  INP   LOCATION:  6525                         FACILITY:  [facility]   PHYSICIAN:  Salvatore Decent. Cornelius Moras, M.D. DATE OF BIRTH:  04-09-1966   DATE OF CONSULTATION:  01/03/2005  DATE OF DISCHARGE:                                   CONSULTATION   REQUESTING PHYSICIAN:  Dr. Bonnee Quin.   REASON FOR CONSULTATION:  Single vessel coronary artery disease.   HISTORY OF PRESENT ILLNESS:  Ms. Candice Evans is a 45 year old African-American  female from [place] with no previous cardiac history but risk factors  notable for history of hypertension, a strong family history of coronary  artery disease, and longstanding and ongoing tobacco abuse as well as  substance abuse, and recent cocaine use. The patient states that she has  suffered from intermittent episodes of substernal chest pain off and on for  the past month. She describes these pains as severe, heavy, dull pain  radiating across her left chest towards the left shoulder and left arm. They  are associated with diaphoresis, shortness of breath and occasional dizzy  spells. They seem to be provoked by exertion, emotional stress, and anger.  The patient reports that most of these episodes of pain seemed to subside  within 5-10 minutes. They have been accelerating somewhat in frequency and  severity recently. She was admitted to the hospital two days ago for  evaluation of such pain. Electrocardiograms were notable for normal sinus  rhythm with T-wave abnormalities suspicious for anterolateral ischemia.  Cardiac enzymes were notable for borderline elevated troponin levels,  although she ruled out for acute myocardial infarction.   The patient was taken for elective cardiac catheterization yesterday by Dr.  Riley Kill. Findings at the time of catheterization include ostial stenosis of  the left anterior descending coronary artery with  otherwise insignificant  coronary artery disease. There appears to be some component of spasm. In the  right coronary artery injections, there appeared to be 30-50% proximal  stenosis of the right coronary artery but this resolved with intracoronary  nitroglycerin. Left coronary injections demonstrates some stenosis of the  proximal left anterior descending coronary artery although the severity is  of question. Left ventricular function appears normal. Cardiac surgical  consultation has been requested.   REVIEW OF SYSTEMS:  GENERAL: The patient reports marginal appetite. She has  lost 20 pounds of weight over the last year. She blames this on her job.  CARDIAC: Notable for symptoms of chest pain as described. The patient denies  prolonged episodes of chest pain lasting more than 20 minutes. The patient  denies any nocturnal chest pain. She reports no history of PND, orthopnea,  lower extremity edema. She has occasional palpitations. She denies syncopal  episodes. RESPIRATORY: Negative. The patient denies productive cough,  hemoptysis, wheezing. GASTROINTESTINAL: Negative. The patient reports no  difficulty swallowing. She reports normal bowel function. She states that  she had some bright red blood per rectum 1 year ago but this resolved  spontaneously. MUSCULOSKELETAL: Negative. The patient denies arthritis or  arthralgias. NEUROLOGIC: Negative, although the patient does report  occasional transient visual disturbances described as blurry vision in both  eyes that seems to be towards the right side of her visual field. This has  been going on for more than a year and the patient attributed it to  hypertension. The patient otherwise denies transient monocular blindness or  transient numbness or weakness involving either upper or lower extremity.  The patient denies history of seizures. GENITOURINARY: Negative. HEENT:  Notable for transient visual disturbance as described. PSYCHIATRIC:  Notable  in that the patient admits to some anxiety and stress.   PAST MEDICAL HISTORY:  1.  Hypertension.  2.  Longstanding tobacco abuse.  3.  Substance abuse.  4.  Low TSH level was discovered this hospital admission.   PAST SURGICAL HISTORY:  Bilateral tubal ligation.   FAMILY HISTORY:  The patient reports several family members with premature  coronary artery disease on her mother's side.   SOCIAL HISTORY:  The patient is single African-American female who lives  here in [place] and works at [employer]. Her job requires heavy lifting  and strenuous activity. She has longstanding tobacco use and frequent  marijuana use as well as frequent and recent cocaine use.   MEDICATIONS PRIOR TO ADMISSION:  None.   DRUG ALLERGIES:  None known.   PHYSICAL EXAMINATION:  GENERAL:  The patient is a well-appearing female who  appears her stated age and in no acute distress. She has remained in sinus  rhythm this hospital admission.  VITAL SIGNS:  She is afebrile. Blood pressure was measured 119/65 yesterday  afternoon. It has ranged as high as 152/80.  HEENT:  Exam is grossly unremarkable.  NECK:  The neck is supple. There is no cervical or supraclavicular  lymphadenopathy. There is no jugular venous distension. There are no carotid  bruits.  CHEST:  Auscultation of the chest include clear symmetrical breath sounds  bilaterally. No wheezes or rhonchi noted.  CARDIOVASCULAR:  Exam was regular rate and rhythm. No murmurs, rubs or  gallops noted.  ABDOMEN:  The abdomen is soft and nontender. There are no palpable masses.  Bowel sounds present.  EXTREMITIES:  Warm and well-perfused. There is no lower extremity edema.  Distal pulses are easily palpable in both lower legs at the ankle. There is  no sign of significant venous insufficiency.  SKIN:  The skin is clean, dry healthy-appearing throughout.  RECTAL/GU:  Exams both deferred. NEUROLOGIC:  Examination is grossly nonfocal.    DIAGNOSTIC TEST:  Cardiac catheterization performed by Dr. Riley Kill is  reviewed. This demonstrates some ostial stenosis of the left anterior  descending coronary artery. In some views this appears less than 50% but in  other views it appears more significant perhaps 50-70%. This clearly extends  to the bifurcation of left main coronary artery and as such would probably  be relatively unfavorable for percutaneous coronary intervention. It is  difficult to tell whether or not some of the narrowing is related to spasm.  There are no discrete areas of ulcerations. There is right dominant coronary  circulation. There otherwise appears to be no significant coronary  atherosclerosis. Left ventricular function appears normal.   IMPRESSION:  Ostial stenosis of the left anterior descending coronary artery  of unclear significance in a patient who gives a convincing history of  symptoms of unstable angina. I am suspicious that the flow-limiting  characteristics of this narrowing is significant, although it is difficult  to tell whether or not  spasm is playing a roll.   RECOMMENDATIONS:  I have discussed issues at length with Ms. Candice Evans and her  son here in the hospital this afternoon. Ms. Candice Evans is adamant at this point  that regardless of our recommendation she would refuse surgical  revascularization if this were felt to be her best choice. Under the  circumstances, I would be reluctant to consider repeat cardiac  catheterization with intravascular ultrasound, as it makes no sense to  repeat this test if the patient is going to refuse surgical intervention.  Under the circumstances, it is even conceivable that engaging intravascular  ultrasound catheter could potentiate acceleration of her symptoms or even  perhaps acute occlusion of the left anterior descending coronary artery,  mandating emergent surgical revascularization. A stress Myoview would be a  reasonable alternative, although again I am  not sure what benefit this would  provide if the patient is going to refuse treatment. Whether or not  percutaneous coronary intervention remains an option is difficult to say,  although clearly anatomical concerns are relatively unfavorable. I have  discussed issues at length with Ms. Candice Evans and all of her questions have  been addressed. Please let me know if we can be of further assistance in the  future.       CHO/MEDQ  D:  01/03/2005  T:  01/04/2005  Job:  [id number]   cc:   Arturo Morton. Riley Kill, M.D. [facility]  [address]  [address]  [place]  [place] [address]   Duke Salvia, M.D.   [facility]

## 2010-10-19 NOTE — H&P (Signed)
NAME:  [name], [name] NO.:  [id number]   MEDICAL RECORD NO.:  [id number]          PATIENT TYPE:  EMS   LOCATION:  MAJO                         FACILITY:  [facility]   PHYSICIAN:  Duke Salvia, M.D.  DATE OF BIRTH:  1966-04-14   DATE OF ADMISSION:  01/01/2005  DATE OF DISCHARGE:                                HISTORY & PHYSICAL   We are asked to see Candice Evans by Dr. Lynelle Doctor in the emergency room because  of chest pain.   HISTORY OF PRESENT ILLNESS:  Candice Evans is a 45 year old woman who presented  to the hospital 10 hours ago with episodes of chest pain that are  accompanied by shortness of breath, diaphoresis. These are described as  heaviness with radiation into the arm. They have been provoked by exertion  and by emotional upset. Point of care markers demonstrate borderline  positive troponins.   Of note is that she has used cocaine intermittently in the past and has used  in the last week. She has also been using marijuana on an almost daily  basis.   Her cardiac risk factors are notable for hypertension, a very strong family  history with multiple deceased aunts in their mid 41's, as well as  hypertension. Her lipid status is not known.   These symptoms recently have been accompanied by significant exercise  intolerance but no nocturnal dyspnea, peripheral edema. There has been no  syncope, no palpitations.   PAST MEDICAL HISTORY:  As above, is notable for abdominal cramping which she  relates to her periods.   REVIEW OF SYSTEMS:  Her review of systems is otherwise negative for GI, GU,  respiratory, musculoskeletal,  genital, neurological, and endocrine.   MEDICATIONS:  She takes no medications.   ALLERGIES:  She has no known drug allergies.   SOCIAL HISTORY:  She works at [employer]. Habits as noted above.   PHYSICAL EXAMINATION:  VITAL SIGNS:  Heart rate was 85, blood pressure  156/64.  GENERAL:  She is well-developed, well-nourished  African-American female  appearing somewhat older than her stated age of 34.  HEENT EXAM:  Demonstrated no icterus or xanthoma.  NECK:  The neck veins were flat. Carotids are brisk and full bilaterally  without bruits.  BACK:  Without kyphosis or scoliosis.  LUNGS:  Clear.  HEART:  Regular heart sounds with an early systolic murmur.  ABDOMEN:  Soft with active bowel sounds, without midline pulsation or  hepatomegaly.  EXTREMITIES:  Femoral pulses are 2+, distal pulses intact. No clubbing,  cyanosis or edema.  NEUROLOGIC:  Grossly normal.  SKIN:  Warm and dry.   Electrocardiogram dated at 8 p.m., 4 hours after arrival, demonstrated sinus  rhythm at 80 with intervals of 0.12/0.09/0.3 with an axis of 11/-16. There  is a 2-3 mm ST segment elevation in leads V1 to V3 with poor R wave  progression.   Electrocardiogram at 0200 where there has been proper placement of V1 and V2  demonstrates poor R wave progression but persistent ST segment elevation in  leads V1 to V3 with now progressive  T wave inversions from V4 to V6 as well  as T wave in leads  aVF, there is flattening in lead 2 and flattening also in 1 and L.   Troponins have been 0.06 and 0.07.   IMPRESSION:  1.  Recurrent chest pain with abnormal electrocardiogram.  2.  Borderline positive troponins.  3.  Recent cocaine use.  4.  Cardiac risk factors notable for:      1.  Family history.      2.  Cigarette use.      3.  Hypertension.   DISCUSSION:  Candice Evans has a strikingly abnormal electrocardiogram with  chest pain consistent with unstable angina. The pretest probability is  markedly diminished by the concomitant use of cocaine; however not  withstanding, I think the likelihood of coronary disease is still quite  high.   In additional to elimination of cocaine, other risk factor modification will  need to be undertaken based on lab tests to be obtained.   PLAN:  1.  Admit and cycle enzymes.  2.  Heparin and  nitroglycerin intravenously.  3.  Benzodiazepines intravenously.  4.  No beta blockers.  5.  Catheterization.  6.  Fasting lipid profile.  7.  Check a TSH.  8.  Urine tox-screen.       SCK/MEDQ  D:  01/02/2005  T:  01/02/2005  Job:  [id number]   cc:   FAMILY [employer]ACTICE [employer]ACHING SERVICE

## 2012-01-29 ENCOUNTER — Ambulatory Visit (INDEPENDENT_AMBULATORY_CARE_PROVIDER_SITE_OTHER): Payer: Self-pay | Admitting: Family Medicine

## 2012-01-29 ENCOUNTER — Encounter: Payer: Self-pay | Admitting: Family Medicine

## 2012-01-29 VITALS — BP 181/94 | HR 68 | Ht 62.0 in | Wt 144.0 lb

## 2012-01-29 DIAGNOSIS — L2089 Other atopic dermatitis: Secondary | ICD-10-CM

## 2012-01-29 DIAGNOSIS — E0590 Thyrotoxicosis, unspecified without thyrotoxic crisis or storm: Secondary | ICD-10-CM

## 2012-01-29 DIAGNOSIS — I10 Essential (primary) hypertension: Secondary | ICD-10-CM

## 2012-01-29 LAB — CBC WITH DIFFERENTIAL/PLATELET
Basophils Absolute: 0 10*3/uL (ref 0.0–0.1)
Basophils Relative: 0 % (ref 0–1)
Eosinophils Absolute: 0.1 10*3/uL (ref 0.0–0.7)
HCT: 36.8 % (ref 36.0–46.0)
MCH: 31 pg (ref 26.0–34.0)
MCV: 89.1 fL (ref 78.0–100.0)
Monocytes Absolute: 0.7 10*3/uL (ref 0.1–1.0)
Monocytes Relative: 7 % (ref 3–12)
Neutro Abs: 4.2 10*3/uL (ref 1.7–7.7)
RDW: 13.8 % (ref 11.5–15.5)
WBC: 9.2 10*3/uL (ref 4.0–10.5)

## 2012-01-29 LAB — T4, FREE: Free T4: 1.22 ng/dL (ref 0.80–1.80)

## 2012-01-29 LAB — COMPREHENSIVE METABOLIC PANEL
ALT: 9 U/L (ref 0–35)
AST: 10 U/L (ref 0–37)
BUN: 12 mg/dL (ref 6–23)
CO2: 28 mEq/L (ref 19–32)
Calcium: 9.1 mg/dL (ref 8.4–10.5)
Creat: 0.65 mg/dL (ref 0.50–1.10)
Potassium: 3.9 mEq/L (ref 3.5–5.3)
Sodium: 141 mEq/L (ref 135–145)
Total Protein: 6.7 g/dL (ref 6.0–8.3)

## 2012-01-29 LAB — T3, FREE: T3, Free: 3.2 pg/mL (ref 2.3–4.2)

## 2012-01-29 MED ORDER — PROPYLTHIOURACIL 50 MG PO TABS: ORAL_TABLET | ORAL | Status: DC

## 2012-01-29 MED ORDER — METOPROLOL TARTRATE 25 MG PO TABS: 50.0000 mg | ORAL_TABLET | Freq: Two times a day (BID) | ORAL | Status: DC

## 2012-01-29 MED ORDER — TRIAMCINOLONE ACETONIDE 0.1 % EX CREA: TOPICAL_CREAM | Freq: Two times a day (BID) | CUTANEOUS | Status: DC

## 2012-01-29 NOTE — Patient Instructions (Addendum)
Thank you for coming in today, it was good to see you I have sent your medications to your pharmacy. I would like to see you back in two weeks. Please let us know if you qualify for the orange card so we can get the thyroid imaging ordered.  If you do not qualify for the orange card we may still be able to get you and appointment at [facility].

## 2012-01-30 ENCOUNTER — Other Ambulatory Visit: Payer: Self-pay | Admitting: Family Medicine

## 2012-01-30 DIAGNOSIS — L2089 Other atopic dermatitis: Secondary | ICD-10-CM

## 2012-01-30 MED ORDER — MOMETASONE FUROATE 0.1 % EX OINT: TOPICAL_OINTMENT | Freq: Every day | CUTANEOUS | Status: DC

## 2012-01-30 NOTE — Telephone Encounter (Signed)
Fwd. To PCP for refill. .Kayleena Eke  

## 2012-01-30 NOTE — Telephone Encounter (Signed)
Patient is calling because the Rx for    mometasone [facility])   She uses [facility] on [address].

## 2012-02-04 NOTE — Assessment & Plan Note (Signed)
Topical steroid refilled.

## 2012-02-04 NOTE — Assessment & Plan Note (Addendum)
BP elevated today, restart beta blocker given palpitations.  Follow up in 2 weeks.

## 2012-02-04 NOTE — Progress Notes (Addendum)
  Subjective:    Patient ID: Candice Evans, female    DOB: 09-26-1965, 46 y.o.   MRN: [id number]  HPI  1. Hyperthyroidism: History of hyperthyroidism.  Was supposed to get [place] and scan of thyroid last year but never went to get orange card.  She has been on PTU in the past after declining radioactive Iodine tx.  She states she has not taken PTU x1 year.  She does endorse palpitations daily.  She denies heat intolerance, diarrhea, increased weight loss.    2.  HTN:  History of HTN.  Does not check BP at home. Has not taken metoprolol x1 year.  She has not been able to afford follow up and has not gotten the orange card. She does endorse some vision changes.  She denies chest pain,shortness of breath, headache   3. Eczema:  Long history or eczema.  Well controlled with topical steroid cream.  Would like to have refill of this.  No current rash.    Review of Systems Per HPI    Objective:   Physical Exam  Constitutional: She appears well-nourished. No distress.  HENT:  Head: Normocephalic and atraumatic.  Neck: Thyromegaly present.  Cardiovascular: Normal rate, regular rhythm and normal heart sounds.   Pulmonary/Chest: Effort normal and breath sounds normal.  Musculoskeletal: She exhibits no edema.          Assessment & Plan:

## 2012-02-04 NOTE — Assessment & Plan Note (Signed)
Non compliant with PTU.  Will restart.  She still needs [place] and scan.   Check TFT's today.  She will check on orange card again.  If unable to get, would like referral to endocrine at [facility].

## 2012-02-12 ENCOUNTER — Ambulatory Visit: Payer: Self-pay | Admitting: Family Medicine

## 2012-02-25 ENCOUNTER — Ambulatory Visit: Payer: Self-pay | Admitting: Family Medicine

## 2012-03-10 ENCOUNTER — Ambulatory Visit: Payer: Self-pay | Admitting: Family Medicine

## 2012-03-17 ENCOUNTER — Ambulatory Visit: Payer: Self-pay | Admitting: Family Medicine

## 2012-04-28 ENCOUNTER — Ambulatory Visit: Payer: Self-pay | Admitting: Family Medicine

## 2012-05-24 ENCOUNTER — Encounter (HOSPITAL_COMMUNITY): Payer: Self-pay | Admitting: Cardiology

## 2012-05-24 ENCOUNTER — Emergency Department (HOSPITAL_COMMUNITY)
Admission: EM | Admit: 2012-05-24 | Discharge: 2012-05-24 | Disposition: A | Discharge status: Home / Self Care | Payer: Self-pay | Attending: Emergency Medicine | Admitting: Emergency Medicine

## 2012-05-24 DIAGNOSIS — Z8639 Personal history of other endocrine, nutritional and metabolic disease: Secondary | ICD-10-CM | POA: Insufficient documentation

## 2012-05-24 DIAGNOSIS — R51 Headache: Secondary | ICD-10-CM | POA: Insufficient documentation

## 2012-05-24 DIAGNOSIS — I10 Essential (primary) hypertension: Secondary | ICD-10-CM | POA: Insufficient documentation

## 2012-05-24 DIAGNOSIS — F17200 Nicotine dependence, unspecified, uncomplicated: Secondary | ICD-10-CM | POA: Insufficient documentation

## 2012-05-24 DIAGNOSIS — R059 Cough, unspecified: Secondary | ICD-10-CM | POA: Insufficient documentation

## 2012-05-24 DIAGNOSIS — Z862 Personal history of diseases of the blood and blood-forming organs and certain disorders involving the immune mechanism: Secondary | ICD-10-CM | POA: Insufficient documentation

## 2012-05-24 DIAGNOSIS — Z872 Personal history of diseases of the skin and subcutaneous tissue: Secondary | ICD-10-CM | POA: Insufficient documentation

## 2012-05-24 DIAGNOSIS — J3489 Other specified disorders of nose and nasal sinuses: Secondary | ICD-10-CM | POA: Insufficient documentation

## 2012-05-24 DIAGNOSIS — Z79899 Other long term (current) drug therapy: Secondary | ICD-10-CM | POA: Insufficient documentation

## 2012-05-24 DIAGNOSIS — R05 Cough: Secondary | ICD-10-CM

## 2012-05-24 MED ORDER — DIPHENHYDRAMINE HCL 50 MG/ML IJ SOLN
25.0000 mg | Freq: Once | INTRAMUSCULAR | Status: AC
  Administered 2012-05-24: 25 mg via INTRAVENOUS
  Filled 2012-05-24: qty 1

## 2012-05-24 MED ORDER — METOCLOPRAMIDE HCL 5 MG/ML IJ SOLN
10.0000 mg | Freq: Once | INTRAMUSCULAR | Status: AC
  Administered 2012-05-24: 10 mg via INTRAVENOUS
  Filled 2012-05-24: qty 2

## 2012-05-24 NOTE — ED Notes (Signed)
Pt reports she has been taking BP medication but feels like is has not been working. States she felt bad yesterday and took some tylenol cold and flu and thinks that may be increased her BP. No neuro deficits present. Pt A&O x4.

## 2012-05-24 NOTE — ED Notes (Addendum)
Pt reports 8/10 bilateral temporal HA since AM "because my BP is so high." States she took a pain rx for it this AM (unsure of what) with some relief as well as Metoprolol. NAD. Pt reports smoking marijuana recently.

## 2012-05-24 NOTE — ED Provider Notes (Signed)
History     CSN: [id number]  Arrival date & time 05/24/12  1307   First MD Initiated Contact with Patient 05/24/12 1319      Chief Complaint  Patient presents with  . Hypertension     Patient is a 46 y.o. female presenting with hypertension. The history is provided by the patient.  Hypertension This is a new problem. The current episode started yesterday. The problem occurs constantly. The problem has been gradually worsening. Associated symptoms include headaches. Pertinent negatives include no chest pain and no shortness of breath. Nothing aggravates the symptoms. Nothing relieves the symptoms. Treatments tried: rest. The treatment provided mild relief.  pt reports she started having nonproductive cough yesterday.  She reports nasal congestion as well.  She reports she took OTC meds and went to sleep.  She reports she wokeup with headache.  She reports headache is not severe but it is not improving  No focal weakness.  No visual changes.  No cp/sob.  No abd pain.  No vomiting.  Past Medical History  Diagnosis Date  . HTN (hypertension)   . Hyperthyroidism   . Eczema     Past Surgical History  Procedure Date  . Cardiac catheterization     LAD 50% stenosed, no stent    Family History  Problem Relation Age of Onset  . Hypertension Father   . Coronary artery disease Father   . Heart failure Father     Died age 25  . Heart failure Brother     Died age 59  . Hypertension Mother     History  Substance Use Topics  . Smoking status: Current Every Day Smoker -- 0.5 packs/day    Types: Cigarettes  . Smokeless tobacco: Never Used     Comment: wants to quitt  . Alcohol Use: No    OB History    Grav Para Term Preterm Abortions TAB SAB Ect Mult Living                  Review of Systems  Constitutional: Negative for fever.  Respiratory: Negative for shortness of breath.   Cardiovascular: Negative for chest pain.  Neurological: Positive for headaches. Negative for  speech difficulty and weakness.  Psychiatric/Behavioral: Negative for agitation.  All other systems reviewed and are negative.    Allergies  Review of patient's allergies indicates no known allergies.  Home Medications   Current Outpatient Rx  Name  Route  Sig  Dispense  Refill  . TYLENOL SEVERE ALLERGY PO   Oral   Take 30 mLs by mouth daily as needed. For cold symptoms.         Marland Kitchen METOPROLOL TARTRATE 25 MG PO TABS   Oral   Take 2 tablets (50 mg total) by mouth 2 (two) times daily.   60 tablet   3   . HYDROCHLOROTHIAZIDE 12.5 MG PO CAPS   Oral   Take 1 capsule (12.5 mg total) by mouth daily.   30 capsule   3   . MOMETASONE FUROATE 0.1 % EX OINT   Topical   Apply topically daily. Apply to affected areas daily   45 g   1     BP 225/97  Pulse 65  Temp 98.3 F (36.8 C) (Oral)  Resp 23  SpO2 96%  Physical Exam CONSTITUTIONAL: Well developed/well nourished HEAD AND FACE: Normocephalic/atraumatic EYES: EOMI/PERRL, no nystagmus ENMT: Mucous membranes moist NECK: supple no meningeal signs, no bruits CV: S1/S2 noted, no murmurs/rubs/gallops noted LUNGS:  Lungs are clear to auscultation bilaterally, no apparent distress ABDOMEN: soft, nontender, no rebound or guarding GU:no cva tenderness NEURO:Awake/alert, facies symmetric, no arm or leg drift is noted Cranial nerves 3/4/5/6/12/09/08/11/12 tested and intact Gait normal No past pointing EXTREMITIES: pulses normal, full ROM SKIN: warm, color normal PSYCH: no abnormalities of mood noted   ED Course  Procedures   1. HTN (hypertension)   2. Headache   3. Cough     Pt well appearing, no focal neuro deficits.  Headache gradual in nature and started after her cough/congestion began and took meds at home.  I doubt hypertensive emergency.  On recheck she is resting comfortably, no cp/sob/weakness.   Advised to f/u with her PCP for better BP control  MDM  Nursing notes including past medical history and social  history reviewed and considered in documentation Previous records reviewed and considered - recent office notes reviewed        Joya Gaskins, MD 05/24/12 1601

## 2012-05-24 NOTE — ED Notes (Signed)
Dr Bebe Shaggy noted of high bp

## 2012-08-26 ENCOUNTER — Ambulatory Visit (INDEPENDENT_AMBULATORY_CARE_PROVIDER_SITE_OTHER): Payer: Self-pay | Admitting: Family Medicine

## 2012-08-26 ENCOUNTER — Encounter: Payer: Self-pay | Admitting: Family Medicine

## 2012-08-26 VITALS — BP 203/146 | HR 90 | Ht 62.0 in | Wt 150.0 lb

## 2012-08-26 DIAGNOSIS — I10 Essential (primary) hypertension: Secondary | ICD-10-CM

## 2012-08-26 DIAGNOSIS — F17200 Nicotine dependence, unspecified, uncomplicated: Secondary | ICD-10-CM

## 2012-08-26 DIAGNOSIS — Z716 Tobacco abuse counseling: Secondary | ICD-10-CM

## 2012-08-26 DIAGNOSIS — L2089 Other atopic dermatitis: Secondary | ICD-10-CM

## 2012-08-26 DIAGNOSIS — Z7189 Other specified counseling: Secondary | ICD-10-CM

## 2012-08-26 DIAGNOSIS — K047 Periapical abscess without sinus: Secondary | ICD-10-CM

## 2012-08-26 DIAGNOSIS — E0590 Thyrotoxicosis, unspecified without thyrotoxic crisis or storm: Secondary | ICD-10-CM

## 2012-08-26 MED ORDER — TRAMADOL HCL 50 MG PO TABS: 50.0000 mg | ORAL_TABLET | Freq: Three times a day (TID) | ORAL | Status: DC | PRN

## 2012-08-26 MED ORDER — PENICILLIN V POTASSIUM 500 MG PO TABS: 500.0000 mg | ORAL_TABLET | Freq: Four times a day (QID) | ORAL | Status: DC

## 2012-08-26 MED ORDER — METOPROLOL TARTRATE 25 MG PO TABS: 50.0000 mg | ORAL_TABLET | Freq: Two times a day (BID) | ORAL | Status: DC

## 2012-08-26 MED ORDER — PROPYLTHIOURACIL 50 MG PO TABS: ORAL_TABLET | ORAL | Status: DC

## 2012-08-26 NOTE — Patient Instructions (Addendum)
General Instructions: Thank you for coming in today, it was good to see you Get your medications refilled and restart If you have chest pain, weakness, severe headache you need to be seen immediately Make an appointment with our Pharmacy Clinic before you leave today. Follow up with me in TWO WEEKS   Use the discount code to see if this makes your medication any cheaper.  Member ID: [id number] RxGroup: [id number] Salt: [id number] RxPCN:  [id number]    Treatment Goals:  Goals (1 Years of Data) as of 08/26/12         As of Today 05/24/12 05/24/12 05/24/12 01/29/12     Blood Pressure    . Blood Pressure < 140/90  203/146 225/97 231/102 235/111 181/94      Progress Toward Treatment Goals:  Treatment Goal 08/26/2012  Blood pressure deteriorated  Stop smoking smoking less    Self Care Goals & Plans:  Self Care Goal 08/26/2012  Manage my medications take my medicines as prescribed  Eat healthy foods eat foods that are low in salt  Be physically active find an activity I enjoy; find a convenient safe place to exercise; take a walk every day  Stop smoking go to the [employer] ([employer]); call [facility] (1-800-QUIT-NOW); set a quit date and stop smoking  Other (No Data)       Care Management & Community Referrals:  Referral 08/26/2012  Referrals made for care management support health educator; pharmacy clinic

## 2012-08-30 DIAGNOSIS — K047 Periapical abscess without sinus: Secondary | ICD-10-CM | POA: Insufficient documentation

## 2012-08-30 NOTE — Assessment & Plan Note (Addendum)
Will treat with PCN and tramadol for pain control.  She plans to see dentist.

## 2012-08-30 NOTE — Progress Notes (Signed)
  Subjective:    Patient ID: Candice Evans, female    DOB: Jun 02, 1966, 47 y.o.   MRN: [id number]  Hypertension   1. HTN:  CHRONIC HYPERTENSION  Disease Monitoring  Blood pressure range: Does not monitor at home  Chest pain: no   Dyspnea: no   Claudication: no   Medication compliance: no, has been out of metoprolol x2 weeks.  Has had trouble keeping appointments due to not being able to afford.  Medication Side Effects  Lightheadedness: no   Urinary frequency: no   Edema: no   Preventitive Healthcare:  Exercise: yes, walks occasionally   Diet Pattern: Eats what she wants  Salt Restriction: No    2. Hyperthyroidism:  History of of hyperthyroidism.  TSH has been very low but normal normal T3 and T4.  Has not had thyroid ultrasound or scan due to not having insurance.  Denies any symptoms including heat or cold intolerance, palpitations, diarrhea.   3. Smoking:  She is interested in quitting smoking.  Set quit date for mothers day this year.  Cut down to 1/2 ppd form 1ppd.   4. Dental pain:  C/o pain in Upper L tooth for the past 2 weeks.  Feels like her gum is swollen in that area.  Tooth is broken off and she has had issues with it in the past.  She denies fever, chills.  Reports she is going to make an appointment with a dentist.   PMHx: SMoking history reviewed, she continues to smoke but has cut back.   Review of Systems Per HPI    Objective:   Physical Exam  Constitutional: She appears well-nourished. No distress.  HENT:  Head: Normocephalic and atraumatic.  Swelling up gum surrounding L upper canine tooth.  No drainage seen.  Tender to touch.   Eyes:  No proptosis   Neck: Neck supple. No thyromegaly present.  Thyroid non-tender   Cardiovascular: Normal rate and regular rhythm.   Pulmonary/Chest: Effort normal and breath sounds normal.  Abdominal: Soft. Bowel sounds are normal.  Musculoskeletal: She exhibits no edema.  Neurological: She has normal reflexes.           Assessment & Plan:

## 2012-08-30 NOTE — Assessment & Plan Note (Addendum)
Has been consistently poorly controlled however she has not been compliant with medications and has not followed up as directed in the past.  Will restart BB given hx of hyperthyroidism. Will likely need an additional agent and I have asked her to follow up with me in one month.

## 2012-08-30 NOTE — Assessment & Plan Note (Signed)
Interested in quitting, will refer to pharmacy clinic.  Has set quit date and given number to [facility] quit line

## 2012-08-30 NOTE — Assessment & Plan Note (Signed)
Has not taken PTU in quite some time because of expenses.  Looks like she should be able to get at [facility] for <$20 for a month supply.  Still does not think she can afford thyroid imaging at this time and does not want to reschedule at this time.  Although her TSH has been low her T3 and T4 has been normal.  She will let me know if she can not afford her PTU.

## 2012-09-09 ENCOUNTER — Ambulatory Visit: Payer: Self-pay | Admitting: Family Medicine

## 2012-09-10 ENCOUNTER — Encounter: Payer: Self-pay | Admitting: Pharmacist

## 2012-09-10 ENCOUNTER — Ambulatory Visit (INDEPENDENT_AMBULATORY_CARE_PROVIDER_SITE_OTHER): Payer: Self-pay | Admitting: Pharmacist

## 2012-09-10 VITALS — BP 207/79 | HR 46 | Ht 62.0 in | Wt 149.0 lb

## 2012-09-10 DIAGNOSIS — Z7189 Other specified counseling: Secondary | ICD-10-CM

## 2012-09-10 DIAGNOSIS — L2089 Other atopic dermatitis: Secondary | ICD-10-CM

## 2012-09-10 DIAGNOSIS — Z716 Tobacco abuse counseling: Secondary | ICD-10-CM

## 2012-09-10 DIAGNOSIS — I10 Essential (primary) hypertension: Secondary | ICD-10-CM

## 2012-09-10 DIAGNOSIS — F17200 Nicotine dependence, unspecified, uncomplicated: Secondary | ICD-10-CM

## 2012-09-10 MED ORDER — MOMETASONE FUROATE 0.1 % EX OINT: TOPICAL_OINTMENT | Freq: Every day | CUTANEOUS | Status: DC

## 2012-09-10 MED ORDER — AMLODIPINE BESYLATE 5 MG PO TABS: 5.0000 mg | ORAL_TABLET | Freq: Every day | ORAL | Status: DC

## 2012-09-10 NOTE — Patient Instructions (Addendum)
It was nice to see you today!  Pick up your amlodipine from [employer]. Continue taking metoprolol.  Plan on your quit date to be in 10 days. Shop around for the nicotine gum 4 mg.  Return to see Dr. Raymondo Band in 7-10 days.

## 2012-09-10 NOTE — Progress Notes (Signed)
  Subjective:    Patient ID: Candice Evans, female    DOB: Apr 21, 1966, 47 y.o.   MRN: [id number]  HPI Patient arrived in good spirits today. She is here to discuss her hypertension and smoking cessation.  She reports that she smokes 1/2 ppd of cigarettes and that she also smokes marijuana. Her target quit date is Mother's Day 2014.  She reports that she tried to quit before, ~2006, using bupropion but it made her apathetic and have nightmares.   Number of Cigarettes per day 10. Brand smoked Marlboro Light Special Blend Most recent quit attempt 2006. What Medications (NRT, bupropion, varenicline) used in past includes burpopion.  Triggers to use tobacco include; stress and other members in the household smoke.      Review of Systems     Objective:   Physical Exam  Blood pressure was elevated to 207/79 using the automated cuff. I checked it manually and it was 208/104. [name] checked it and it was 204/112.   She has no swelling in her legs.      Assessment & Plan:  Chronic uncontrolled hypertension on single drug therapy.  Likely poor adherence to drug therapy in the past.  Evaluated with  DR. Mauricio Po, gave her a dose of clonidine 0.1 mg (Lot [id number], Exp 07/14).  Initiated amlodipine 5 mg every day to begin today after she picks up from Pharmacy. Reevaluate BP in 7-10 days at follow up in Rx clinic.   Moderate Nicotine Dependence of many years duration in a patient who is excellent candidate for success b/c of her motivation and her progress thus far.    Plan to initiate nicotine replacement tx. Patient is to shop around for the 4 mg nicotine gum. She will bring it to her next visit. Patient counseled on purpose, proper use, and potential adverse effects, including burning sensation in mouth and stomach upset.  Written information provided. Patient is currently using the 1 800-QUIT NOW support program. F/U Rx Clinic Visit in 7-10 days. Her goal is to be smoking 5-6 cigarettes  then. Total time in face-to-face counseling 25 minutes.  Patient seen with [name], PharmD candidate.  Refilled Mometasone topical for her eczema.

## 2012-09-10 NOTE — Assessment & Plan Note (Signed)
  Moderate Nicotine Dependence of many years duration in a patient who is excellent candidate for success b/c of her motivation and her progress thus far.    Plan to initiate nicotine replacement tx. Patient is to shop around for the 4 mg nicotine gum. She will bring it to her next visit. Patient counseled on purpose, proper use, and potential adverse effects, including burning sensation in mouth and stomach upset.  Written information provided. Patient is currently using the 1 800-QUIT NOW support program. F/U Rx Clinic Visit in 7-10 days. Her goal is to be smoking 5-6 cigarettes then. Total time in face-to-face counseling 25 minutes.  Patient seen with [name], PharmD candidate.

## 2012-09-10 NOTE — Assessment & Plan Note (Signed)
Chronic uncontrolled hypertension on single drug therapy.  Likely poor adherence to drug therapy in the past.  Evaluated with  DR. Mauricio Po, gave her a dose of clonidine 0.1 mg (Lot [id number], Exp 07/14).  Initiated amlodipine 5 mg every day to begin today after she picks up from Pharmacy. Reevaluate BP in 7-10 days at follow up in Rx clinic.

## 2012-09-11 NOTE — Progress Notes (Signed)
Patient ID: Candice Evans, female   DOB: 01/06/1966, 46 y.o.   MRN: [id number] Reviewed: Agree with Dr. Koval's documentation and management. 

## 2012-09-24 ENCOUNTER — Encounter: Payer: Self-pay | Admitting: Pharmacist

## 2012-09-24 ENCOUNTER — Ambulatory Visit (INDEPENDENT_AMBULATORY_CARE_PROVIDER_SITE_OTHER): Payer: Self-pay | Admitting: Pharmacist

## 2012-09-24 VITALS — BP 157/92 | HR 69 | Ht 62.5 in | Wt 146.0 lb

## 2012-09-24 DIAGNOSIS — Z716 Tobacco abuse counseling: Secondary | ICD-10-CM

## 2012-09-24 DIAGNOSIS — Z7189 Other specified counseling: Secondary | ICD-10-CM

## 2012-09-24 DIAGNOSIS — F17200 Nicotine dependence, unspecified, uncomplicated: Secondary | ICD-10-CM

## 2012-09-24 DIAGNOSIS — I10 Essential (primary) hypertension: Secondary | ICD-10-CM

## 2012-09-24 MED ORDER — CARVEDILOL 25 MG PO TABS: 25.0000 mg | ORAL_TABLET | Freq: Two times a day (BID) | ORAL | Status: DC

## 2012-09-24 MED ORDER — LISINOPRIL-HYDROCHLOROTHIAZIDE 10-12.5 MG PO TABS: 1.0000 | ORAL_TABLET | Freq: Every day | ORAL | Status: DC

## 2012-09-24 NOTE — Progress Notes (Signed)
  Subjective:    Patient ID: Candice Evans, female    DOB: [birth year], 47 y.o.   MRN: [id number]  HPI Patient arrived in good spirits today. She is here for hypertension and smoking cessation follow-up.  She reports that she is currently smoking 7 cigarettes per day. Her target quit date is still Mother's Day 2014.  Nicotine gum was too expensive.  Patient states that she has been compliant with blood pressure medications. She is concerned that the amlodipine is causing leg tightness.   Review of Systems     Objective:   Physical Exam  No leg swelling present      Assessment & Plan:  Chronic uncontrolled hypertension on multi-drug therapy. Better adherence to drug therapy than in the past. Her blood pressure was 187/98 when she first arrived and after a few minutes, dropped to 157/92. Initiated lisinopril-HCTZ 10-12.5 mg daily and switched metoprolol tartrate 50 mg BID to carvedilol 25 mg BID.   Moderate Nicotine Dependence of many years duration in a patient who is good candidate for success b/c of her motivation. She has been able to cut back from 10 cigarettes to 7 cigarettes daily. We did not initiate nicotine replacement tx as patient cannot afford it and is awaiting approval for Medicaid. She plans to cut back to 3 cigarettes by Mother's Day 2014. She is interested in Chantix if she is unable to quit after Mother's Day. F/U Rx Clinic Visit in 2 weeks.  Total time in face-to-face counseling 20 minutes.  Patient seen with Abran Duke, PharmD Resident and [name], PharmD candidate. Marland Kitchen

## 2012-09-24 NOTE — Patient Instructions (Addendum)
It was great to see you today!  Your plan is still to quit by Mother's Day. You are currently, smoking 7 cigarettes. If you feel that you can cut out your first cigarette, then do so.  You can do it! It is tough but will be worth all of your hard work!!  Your blood pressure has improved since your last visit.   Stop taking metoprolol and start taking carvedilol 25 mg twice a day  Stop taking hydrochlorothiazide and start taking lisinopril-hydrochlorothiazide 10-12.5 mg once a day  Come back to see Dr. Raymondo Band shortly after Mother's Day (2 weeks).

## 2012-09-24 NOTE — Assessment & Plan Note (Signed)
Chronic uncontrolled hypertension on multi-drug therapy. Better adherence to drug therapy than in the past. Her blood pressure was 187/98 when she first arrived and after a few minutes, dropped to 157/92. Initiated lisinopril-HCTZ 10-12.5 mg daily and switched metoprolol tartrate 50 mg BID to carvedilol 25 mg BID.

## 2012-09-24 NOTE — Assessment & Plan Note (Signed)
Chronic uncontrolled hypertension on multi-drug therapy. Better adherence to drug therapy than in the past. Her blood pressure was 187/98 when she first arrived and after a few minutes, dropped to 157/92. Initiated lisinopril-HCTZ 10-12.5 mg daily and switched metoprolol tartrate 50 mg BID to carvedilol 25 mg BID.   Moderate Nicotine Dependence of many years duration in a patient who is good candidate for success b/c of her motivation. She has been able to cut back from 10 cigarettes to 7 cigarettes daily. We did not initiate nicotine replacement tx as patient cannot afford it and is awaiting approval for Medicaid. She plans to cut back to 3 cigarettes by Mother's Day 2014. She is interested in Chantix if she is unable to quit after Mother's Day. F/U Rx Clinic Visit in 2 weeks.  Total time in face-to-face counseling 20 minutes.  Patient seen with Abran Duke, PharmD Resident and [name], PharmD candidate.

## 2012-09-25 NOTE — Progress Notes (Signed)
Patient ID: Candice Evans, female   DOB: 02/04/1966, 47 y.o.   MRN: [id number] Reviewed: Agree with Dr. Macky Lower documentation and management.

## 2012-10-10 ENCOUNTER — Other Ambulatory Visit: Payer: Self-pay | Admitting: Family Medicine

## 2012-10-13 ENCOUNTER — Ambulatory Visit: Payer: Self-pay | Admitting: Pharmacist

## 2012-10-22 ENCOUNTER — Ambulatory Visit: Payer: Self-pay | Admitting: Pharmacist

## 2012-11-23 ENCOUNTER — Other Ambulatory Visit: Payer: Self-pay | Admitting: *Deleted

## 2012-11-23 DIAGNOSIS — E0590 Thyrotoxicosis, unspecified without thyrotoxic crisis or storm: Secondary | ICD-10-CM

## 2012-11-23 MED ORDER — PROPYLTHIOURACIL 50 MG PO TABS: ORAL_TABLET | ORAL | Status: DC

## 2012-11-30 ENCOUNTER — Other Ambulatory Visit: Payer: Self-pay | Admitting: *Deleted

## 2012-11-30 DIAGNOSIS — E0590 Thyrotoxicosis, unspecified without thyrotoxic crisis or storm: Secondary | ICD-10-CM

## 2012-11-30 MED ORDER — PROPYLTHIOURACIL 50 MG PO TABS: ORAL_TABLET | ORAL | Status: DC

## 2012-12-23 ENCOUNTER — Ambulatory Visit: Payer: Self-pay | Admitting: Family Medicine

## 2013-02-05 ENCOUNTER — Telehealth: Payer: Self-pay | Admitting: Family Medicine

## 2013-02-05 NOTE — Telephone Encounter (Signed)
Spoke with patietn and informed her to call her pharmacy to send up refill request

## 2013-02-05 NOTE — Telephone Encounter (Signed)
Pt is requesting refills on lisinopril be sent to [facility] on [address] and [place] to [facility] on [address]. JW

## 2013-02-10 ENCOUNTER — Other Ambulatory Visit: Payer: Self-pay | Admitting: Family Medicine

## 2013-02-17 ENCOUNTER — Ambulatory Visit (INDEPENDENT_AMBULATORY_CARE_PROVIDER_SITE_OTHER): Payer: Self-pay | Admitting: Family Medicine

## 2013-02-17 ENCOUNTER — Encounter: Payer: Self-pay | Admitting: Family Medicine

## 2013-02-17 VITALS — BP 176/85 | HR 64 | Temp 97.7°F | Ht 62.5 in | Wt 143.0 lb

## 2013-02-17 DIAGNOSIS — E0590 Thyrotoxicosis, unspecified without thyrotoxic crisis or storm: Secondary | ICD-10-CM

## 2013-02-17 DIAGNOSIS — F1411 Cocaine abuse, in remission: Secondary | ICD-10-CM

## 2013-02-17 DIAGNOSIS — I10 Essential (primary) hypertension: Secondary | ICD-10-CM

## 2013-02-17 LAB — T4, FREE: Free T4: 1.02 ng/dL (ref 0.80–1.80)

## 2013-02-17 LAB — T3, FREE: T3, Free: 2.8 pg/mL (ref 2.3–4.2)

## 2013-02-17 NOTE — Progress Notes (Signed)
  Subjective:    Patient ID: Candice Evans, female    DOB: 09/22/1965, 47 y.o.   MRN: [id number]  HPI  47 year old female with a history of hypertension and hyperthyroidism who presents for followup today.   Hyperthyroidism:  - Diagnosed in 2006, as never had thyroid imaging or radioactive iodine uptake scan, and is not no definitive also hyperthyroidism, never seen an endocrinologist - Has bee on PTU since that time intermittently, but has not taken it for several months - last TSH checked in August 2013 and it was low, however at that time T3 and T4 were both normal - She describes her symptoms as intermittent, specifically she has anxiety and hyperactivity as well as occasional double vision when she looks at lights, also feels like her hair falls out in various places; patient denies palpitations, excessive sweating, skin changes, and diarrhea   Hypertension  Home BP monitoring: not checked  Office BP: BP Readings from Last 3 Encounters:  02/17/13 176/85  09/24/12 157/92  09/10/12 207/79    Prescribed meds: lisinopril-hydrochlorothiazide 10-12.5 mg daily  Hypertension ROS:  Taking medications as prescribed:No, has been out recently Chest pain: No Shortness of breath: No Swelling of extremities: No TIA symptoms: No   Review of Systems See HPI    Objective:   Physical Exam  BP 176/85  Pulse 64  Temp(Src) 97.7 F (36.5 C) (Oral)  Ht 5' 2.5" (1.588 m)  Wt 143 lb (64.864 kg)  BMI 25.72 kg/m2   Eyes: Mild exophthalmos Neg: no thyromegaly, no nodules or tenderness of thyroid CV: Regular rate and rhythm, no murmurs, 2+ peripheral pulses Lungs: clear to auscultation bilaterally Neuro: 1+ deep tendon reflexes Skin: warm and dry without rashes      Assessment & Plan:

## 2013-02-17 NOTE — Patient Instructions (Addendum)
It was a pleasure to meet you today. We will start by getting the following labs: TSH, T3, T4. That will give [place] information about your thyroid level. I will call you to discuss the results in the next 24-48 hours.   Sincerely,   Dr. Clinton Sawyer

## 2013-02-18 DIAGNOSIS — F1411 Cocaine abuse, in remission: Secondary | ICD-10-CM | POA: Insufficient documentation

## 2013-02-18 NOTE — Assessment & Plan Note (Signed)
Assessment: uncontrolled HTN in setting of possible hyperthyroidism and medication non-compliance Plan: first assess thyroid function and then address BP as pt likely to need increased doses of current med and addition of another if HTN truly essential and not secondary

## 2013-02-18 NOTE — Assessment & Plan Note (Addendum)
Assessment: Uncertain of current state of thyroid function or cause of previous hyperthryoidism as she never underwent a thyroid radioactive iodine uptake scan Plan: Will start by checking TSH, free T3 and free T4, no need for imaging or medication until that has been decided

## 2013-02-19 ENCOUNTER — Telehealth: Payer: Self-pay | Admitting: Family Medicine

## 2013-02-19 NOTE — Telephone Encounter (Signed)
Left message with patient that thyroid studies normal and asked that she call back to the office to discuss her care.

## 2013-02-19 NOTE — Telephone Encounter (Signed)
Pt returned Dr Yetta Numbers call

## 2013-02-22 ENCOUNTER — Telehealth: Payer: Self-pay | Admitting: Family Medicine

## 2013-02-22 DIAGNOSIS — I10 Essential (primary) hypertension: Secondary | ICD-10-CM

## 2013-02-22 MED ORDER — LISINOPRIL-HYDROCHLOROTHIAZIDE 20-25 MG PO TABS: 1.0000 | ORAL_TABLET | Freq: Every day | ORAL | Status: DC

## 2013-02-22 NOTE — Telephone Encounter (Signed)
I called patient to confirm that thyroid studies were normal and that no thyroid medication or radioactive iodine test was necessary. I also stated that I would like to get an ultrasound of her thyroid, to make sure there no nodules. She requested that we wait until she gets approved for financial assistance. I think this is reasonable. I also addressed with her the fact that her blood pressure was elevated last visit and we do not think it is related to her thyroid, because of was normal. Therefore we will increase her dose of lisinopril/HCTZ to 20/25. She is agreeable to this plan. She will follow up 2 weeks after starting her new blood pressure medication.

## 2013-08-27 ENCOUNTER — Encounter: Payer: Self-pay | Admitting: Family Medicine

## 2013-08-27 ENCOUNTER — Ambulatory Visit (INDEPENDENT_AMBULATORY_CARE_PROVIDER_SITE_OTHER): Payer: Self-pay | Admitting: Family Medicine

## 2013-08-27 VITALS — BP 191/92 | HR 62 | Temp 98.5°F | Ht 62.0 in | Wt 149.2 lb

## 2013-08-27 DIAGNOSIS — I10 Essential (primary) hypertension: Secondary | ICD-10-CM

## 2013-08-27 MED ORDER — AMLODIPINE BESYLATE 10 MG PO TABS: 10.0000 mg | ORAL_TABLET | Freq: Every day | ORAL | Status: DC

## 2013-08-27 MED ORDER — LISINOPRIL-HYDROCHLOROTHIAZIDE 20-25 MG PO TABS: 1.0000 | ORAL_TABLET | Freq: Every day | ORAL | Status: DC

## 2013-08-27 NOTE — Patient Instructions (Addendum)
Dear Ms. Candice Evans,   Thank you for coming to clinic today. Please read below regarding the issues that we discussed.   1. Blood pressure - Please restart the two medications that I sent to the pharmacy.   2. Physical - Please come back for cervical cancer screening and immunizations.   3. Smoking Cessation - Please call the quit line to talk to a counselor. This is a great option. Also, please read about Chantix.   Please follow up in clinic in 4 weeks. Please call earlier if you have any questions or concerns.   Sincerely,   Dr. Clinton SawyerWilliamson

## 2013-08-27 NOTE — Progress Notes (Signed)
   Subjective:    Patient ID: Candice Evans, female    DOB: 06/07/1965, 48 y.o.   MRN: [id number]  HPI  48 year old F who presents for follow up.   Thyroid dysfunction - Pt reported a history thyroid dysfunction since 2006 but never took medication consistently and had no imaging does. She presented in September for evaluation of the thyroid. Per blood testing, her TSH, free t4 and t3 were normal. I now she denies any palpitations, anxiety, jitteriness, or decreased appetite.  Hypertension  Home BP monitoring: no  Office BP: BP Readings from Last 3 Encounters:  08/27/13 191/92  02/17/13 176/85  09/24/12 157/92    Prescribed meds: see below  Hypertension ROS:  Taking medications as prescribed:No - patient has been out of all of her blood pressure medications for several weeks Chest pain: No Shortness of breath: No Swelling of extremities: No TIA symptoms: No Regular exercise: No Low Na+ diet: No Alcohol/tobacco/drug use: Yes - the patient uses tobacco and would like to quit, but it helps her deal with stress   Current Outpatient Prescriptions on File Prior to Visit  Medication Sig Dispense Refill  . amLODipine (NORVASC) 5 MG tablet TAKE 1 TABLET (5 MG TOTAL) BY MOUTH DAILY.  90 tablet  1  . carvedilol (COREG) 25 MG tablet Take 1 tablet (25 mg total) by mouth 2 (two) times daily with a meal.  60 tablet  3  . hydrochlorothiazide (,MICROZIDE/HYDRODIURIL,) 12.5 MG capsule Take 1 capsule (12.5 mg total) by mouth daily.  30 capsule  3  . lisinopril-hydrochlorothiazide (PRINZIDE,ZESTORETIC) 20-25 MG per tablet Take 1 tablet by mouth daily.  30 tablet  2  . mometasone (ELOCON) 0.1 % ointment Apply topically daily. Apply to affected areas daily  45 g  1  . traMADol (ULTRAM) 50 MG tablet Take 1 tablet (50 mg total) by mouth every 8 (eight) hours as needed for pain.  30 tablet  0   No current facility-administered medications on file prior to visit.      Review of Systems See hpi      Objective:   Physical Exam  BP 191/92  Pulse 62  Temp(Src) 98.5 F (36.9 C) (Oral)  Ht 5\' 2"  (1.575 m)  Wt 149 lb 3.2 oz (67.677 kg)  BMI 27.28 kg/m2  Eyes: Mild exophthalmos, funduscopic exam negative for papilledema  Neg: no thyromegaly, no nodules or tenderness of thyroid  CV: Regular rate and rhythm, no murmurs, 2+ peripheral pulses  Lungs: clear to auscultation bilaterally      Assessment & Plan:

## 2013-09-10 ENCOUNTER — Telehealth: Payer: Self-pay | Admitting: Family Medicine

## 2013-09-10 NOTE — Telephone Encounter (Signed)
Pt needs to follow up to see how her BP is doing before I can make a decision about starting another medication. Please ask her to schedule an appointment. Thanks.

## 2013-09-10 NOTE — Telephone Encounter (Signed)
Pt called because she has a new medication called Amlodipine and she doesn't know why she is taking this. She has never taken and it is giving her a headache. She stopped taking and wanted to know what the name of the medication she was taking before that was twice a day refilled. There are several medications that she has taken twice a day so I am not sure which one she is referring to and she couldn't remember the name. Please call and advise her on what she is to do. jw

## 2013-09-10 NOTE — Telephone Encounter (Signed)
Pt notified and is agreeable.  Osamu Olguin L Kesia Dalto, CMA  

## 2013-09-10 NOTE — Telephone Encounter (Signed)
Spoke with patient, she state the amlodipine gives her a headache and she has stopped taking it, no problems taking the lisinopril-hctz. She states she was to go back to taking the carvedilol. Will forward to PCP.

## 2013-12-30 ENCOUNTER — Telehealth: Payer: Self-pay | Admitting: Family Medicine

## 2013-12-30 NOTE — Telephone Encounter (Signed)
Refill request for mometasone cream.

## 2014-01-04 ENCOUNTER — Other Ambulatory Visit: Payer: Self-pay | Admitting: Family Medicine

## 2014-01-04 DIAGNOSIS — L2089 Other atopic dermatitis: Secondary | ICD-10-CM

## 2014-01-04 MED ORDER — MOMETASONE FUROATE 0.1 % EX OINT: TOPICAL_OINTMENT | Freq: Every day | CUTANEOUS | Status: DC

## 2014-01-04 NOTE — Telephone Encounter (Signed)
Please inform the patient that her medication has been refilled.   Thank you!

## 2014-01-04 NOTE — Telephone Encounter (Signed)
Patient informed. 

## 2014-12-08 ENCOUNTER — Ambulatory Visit (INDEPENDENT_AMBULATORY_CARE_PROVIDER_SITE_OTHER): Payer: Self-pay | Admitting: Family Medicine

## 2014-12-08 ENCOUNTER — Encounter: Payer: Self-pay | Admitting: Family Medicine

## 2014-12-08 VITALS — BP 230/91 | HR 69 | Temp 98.0°F | Wt 153.0 lb

## 2014-12-08 DIAGNOSIS — I10 Essential (primary) hypertension: Secondary | ICD-10-CM

## 2014-12-08 DIAGNOSIS — I160 Hypertensive urgency: Secondary | ICD-10-CM

## 2014-12-08 LAB — COMPREHENSIVE METABOLIC PANEL
ALT: 12 U/L (ref 0–35)
AST: 12 U/L (ref 0–37)
Albumin: 3.9 g/dL (ref 3.5–5.2)
Alkaline Phosphatase: 89 U/L (ref 39–117)
BUN: 12 mg/dL (ref 6–23)
CALCIUM: 9.1 mg/dL (ref 8.4–10.5)
CHLORIDE: 104 meq/L (ref 96–112)
CO2: 27 meq/L (ref 19–32)
CREATININE: 0.75 mg/dL (ref 0.50–1.10)
GLUCOSE: 121 mg/dL — AB (ref 70–99)
Potassium: 3.4 mEq/L — ABNORMAL LOW (ref 3.5–5.3)
Sodium: 141 mEq/L (ref 135–145)
Total Bilirubin: 0.3 mg/dL (ref 0.2–1.2)
Total Protein: 6.9 g/dL (ref 6.0–8.3)

## 2014-12-08 MED ORDER — LISINOPRIL-HYDROCHLOROTHIAZIDE 20-25 MG PO TABS: 1.0000 | ORAL_TABLET | Freq: Every day | ORAL | Status: DC

## 2014-12-08 MED ORDER — AMLODIPINE BESYLATE 10 MG PO TABS: 10.0000 mg | ORAL_TABLET | Freq: Every day | ORAL | Status: DC

## 2014-12-08 NOTE — Progress Notes (Signed)
Patient ID: Candice Evans, female   DOB: 07/25/1965, 49 y.o.   MRN: [id number]    [facility] Candice Jollyaleb G Annett Boxwell, MD Phone: [phone]  Subjective:   # Hypertensive Urgency  - Pt. Here presenting from work with elevated blood pressure to 220 systolic at work. She works at a nursing home.  - She came in this morning after having this elevated blood pressure last night.  - 230/91 here.  - She has not had any vision changes, headache, nausea, vomiting.  - She does not have any aches, chest pain, SOB, neck pain, shoulder pain, or abdominal pain.  - She does not have any back pain.  - She has chronic htn but has not seen a doctor in > 1 year due to not having insurance.  - She has not taken her medication in > 1 year.  - She was on a 3 drug regimen previously.  - She says she now has insurance and can get her blood pressure meds today.   All relevant systems were reviewed and were negative unless otherwise noted in the HPI  Past Medical History Reviewed problem list.  Medications- reviewed and updated Current Outpatient Prescriptions  Medication Sig Dispense Refill  . amLODipine (NORVASC) 10 MG tablet Take 1 tablet (10 mg total) by mouth daily. 30 tablet 0  . lisinopril-hydrochlorothiazide (PRINZIDE,ZESTORETIC) 20-25 MG per tablet Take 1 tablet by mouth daily. 30 tablet 0  . mometasone (ELOCON) 0.1 % ointment Apply topically daily. Apply to affected areas daily 45 g 1   No current facility-administered medications for this visit.   Chief complaint-noted No additions to family history Social history- patient is a non smoker  Objective: BP 230/91 mmHg  Pulse 69  Temp(Src) 98 F (36.7 C) (Oral)  Wt 153 lb (69.4 kg) Gen: NAD, alert, cooperative with exam HEENT: NCAT, EOMI, PERRL Neck: FROM, supple CV: RRR, good S1/S2, no murmur, No S3/S4. 2+ distal pulses.  Resp: CTABL, no wheezes, non-labored Abd: SNTND, BS present, no guarding or organomegaly Ext: No  edema, warm, normal tone, moves UE/LE spontaneously Neuro: Alert and oriented, No gross deficits, CNII-XII intact, Full motor and sensation in all locations.  Skin: no rashes no lesions  Assessment/Plan:  # HTN Urgency - pt. Here with significantly elevated blood pressure without symptoms at this time.  - Get CMET - Refill home regimen.  - Has appt. Already with Dr. Wende MottMcKEag on 7/19. Will need to f/u with PCP for continued HTN mgmt.  - Blood pressure check in the am, and will follow up with provider on Monday to make sure things are going well.  - REturn precautions including vision changes, headache, nausea, vomiting, chest pain, back pain, or for any other concern she should go to the ED. These were discussed at length with her.

## 2014-12-08 NOTE — Patient Instructions (Addendum)
Managing Your High Blood Pressure Blood pressure is a measurement of how forceful your blood is pressing against the walls of the arteries. Arteries are muscular tubes within the circulatory system. Blood pressure does not stay the same. Blood pressure rises when you are active, excited, or nervous; and it lowers during sleep and relaxation. If the numbers measuring your blood pressure stay above normal most of the time, you are at risk for health problems. High blood pressure (hypertension) is a long-term (chronic) condition in which blood pressure is elevated. A blood pressure reading is recorded as two numbers, such as 120 over 80 (or 120/80). The first, higher number is called the systolic pressure. It is a measure of the pressure in your arteries as the heart beats. The second, lower number is called the diastolic pressure. It is a measure of the pressure in your arteries as the heart relaxes between beats.  Keeping your blood pressure in a normal range is important to your overall health and prevention of health problems, such as heart disease and stroke. When your blood pressure is uncontrolled, your heart has to work harder than normal. High blood pressure is a very common condition in adults because blood pressure tends to rise with age. Men and women are equally likely to have hypertension but at different times in life. Before age 45, men are more likely to have hypertension. After 51 years of age, women are more likely to have it. Hypertension is especially common in African Americans. This condition often has no signs or symptoms. The cause of the condition is usually not known. Your caregiver can help you come up with a plan to keep your blood pressure in a normal, healthy range. BLOOD PRESSURE STAGES Blood pressure is classified into four stages: normal, prehypertension, stage 1, and stage 2. Your blood pressure reading will be used to determine what type of treatment, if any, is necessary.  Appropriate treatment options are tied to these four stages:  Normal  Systolic pressure (mm Hg): below 120.  Diastolic pressure (mm Hg): below 80. Prehypertension  Systolic pressure (mm Hg): 120 to 139.  Diastolic pressure (mm Hg): 80 to 89. Stage1  Systolic pressure (mm Hg): 140 to 159.  Diastolic pressure (mm Hg): 90 to 99. Stage2  Systolic pressure (mm Hg): 160 or above.  Diastolic pressure (mm Hg): 100 or above. RISKS RELATED TO HIGH BLOOD PRESSURE Managing your blood pressure is an important responsibility. Uncontrolled high blood pressure can lead to:  A heart attack.  A stroke.  A weakened blood vessel (aneurysm).  Heart failure.  Kidney damage.  Eye damage.  Metabolic syndrome.  Memory and concentration problems. HOW TO MANAGE YOUR BLOOD PRESSURE Blood pressure can be managed effectively with lifestyle changes and medicines (if needed). Your caregiver will help you come up with a plan to bring your blood pressure within a normal range. Your plan should include the following: Education  Read all information provided by your caregivers about how to control blood pressure.  Educate yourself on the latest guidelines and treatment recommendations. New research is always being done to further define the risks and treatments for high blood pressure. Lifestylechanges  Control your weight.  Avoid smoking.  Stay physically active.  Reduce the amount of salt in your diet.  Reduce stress.  Control any chronic conditions, such as high cholesterol or diabetes.  Reduce your alcohol intake. Medicines  Several medicines (antihypertensive medicines) are available, if needed, to bring blood pressure within a normal range.   Communication  Review all the medicines you take with your caregiver because there may be side effects or interactions.  Talk with your caregiver about your diet, exercise habits, and other lifestyle factors that may be contributing to  high blood pressure.  See your caregiver regularly. Your caregiver can help you create and adjust your plan for managing high blood pressure. RECOMMENDATIONS FOR TREATMENT AND FOLLOW-UP  The following recommendations are based on current guidelines for managing high blood pressure in nonpregnant adults. Use these recommendations to identify the proper follow-up period or treatment option based on your blood pressure reading. You can discuss these options with your caregiver.  Systolic pressure of 120 to 139 or diastolic pressure of 80 to 89: Follow up with your caregiver as directed.  Systolic pressure of 140 to 160 or diastolic pressure of 90 to 100: Follow up with your caregiver within 2 months.  Systolic pressure above 160 or diastolic pressure above 100: Follow up with your caregiver within 1 month.  Systolic pressure above 180 or diastolic pressure above 110: Consider antihypertensive therapy; follow up with your caregiver within 1 week.  Systolic pressure above 200 or diastolic pressure above 120: Begin antihypertensive therapy; follow up with your caregiver within 1 week. Document Released: 02/12/2012 Document Reviewed: 02/12/2012 [facility] Patient Information 2015 [place]. This information is not intended to replace advice given to you by your health care provider. Make sure you discuss any questions you have with your health care provider.    You should come back tomorrow for a blood pressure check .   We will check your blood work today.   Go and get your prescriptions today and take them.    Sincerely,  Devota Pacealeb Meekah Math, MD Family Medicine - PGY 2

## 2014-12-09 ENCOUNTER — Encounter: Payer: Self-pay | Admitting: *Deleted

## 2014-12-09 ENCOUNTER — Ambulatory Visit (INDEPENDENT_AMBULATORY_CARE_PROVIDER_SITE_OTHER): Payer: Self-pay | Admitting: *Deleted

## 2014-12-09 VITALS — BP 180/90 | HR 73

## 2014-12-09 DIAGNOSIS — I10 Essential (primary) hypertension: Secondary | ICD-10-CM

## 2014-12-09 DIAGNOSIS — Z0130 Encounter for examination of blood pressure without abnormal findings: Secondary | ICD-10-CM

## 2014-12-09 DIAGNOSIS — Z136 Encounter for screening for cardiovascular disorders: Secondary | ICD-10-CM

## 2014-12-09 NOTE — Progress Notes (Signed)
   Pt in nurse clinic for blood pressure check. BP left arm 180/90 manually, heart rate 73.  Pt stated she took blood pressure medication as prescribed.  Pt stated when she took the Norvasc, her head felt tight.  Yesterday was the first day she started back on the medication.  BP recheck right arm 180/98 manually.  Pt scheduled an appt with Dr. Raymondo BandKoval to discuss smoking and high blood pressure 12/19/14 at 11:15 AM.  Clovis PuMartin, Tamika L, RN

## 2014-12-19 ENCOUNTER — Ambulatory Visit (INDEPENDENT_AMBULATORY_CARE_PROVIDER_SITE_OTHER): Payer: No Typology Code available for payment source | Admitting: Pharmacist

## 2014-12-19 ENCOUNTER — Encounter: Payer: Self-pay | Admitting: Pharmacist

## 2014-12-19 VITALS — BP 132/72 | HR 83 | Ht 62.5 in | Wt 149.2 lb

## 2014-12-19 DIAGNOSIS — I10 Essential (primary) hypertension: Secondary | ICD-10-CM | POA: Diagnosis not present

## 2014-12-19 DIAGNOSIS — Z13220 Encounter for screening for lipoid disorders: Secondary | ICD-10-CM

## 2014-12-19 DIAGNOSIS — Z716 Tobacco abuse counseling: Secondary | ICD-10-CM

## 2014-12-19 DIAGNOSIS — E0590 Thyrotoxicosis, unspecified without thyrotoxic crisis or storm: Secondary | ICD-10-CM | POA: Diagnosis not present

## 2014-12-19 NOTE — Progress Notes (Deleted)
Subjective:   Alcario Droughtrica is a 49 y.o. female who has been smoking *** cigarettes per day for the past *** years. There has/have been *** attempts to quit and *** successes.  QUIT DATE: ***  She is currently on {med nicotine cessation:311107}.  The following triggers has/have been identified: {hx nicotine triggers:311123}  Identified withdrawal symptoms: {hx withdrawal symptoms:311124}.  She rates her stage of change as: {gen change stage:310751}.  Subjective findings: ***  ASSESSMENT Patient's stage of change (readiness scale): {gen change stage:310751}  Behavior change readiness assessment::  {dx readiness to change:310749}  PLAN Advice given to stop smoking Referral to Smoking cessation program   We are getting some lab tests done today in order for Dr. Wende MottMcKeag to have the results for your appointment tomorrow.  Continue to quit smoking and start using Nicotine gum 4mg . Chew half a piece (2mg ) every 1-2 hours as you need it. Remember to chew and park, chew for about 5 seconds then hold the gum in the side of your mouth in order for the nicotine to get absorbed.  Continue to take your blood pressure medications. Your blood pressure has been trending down since taking those medications so continue to keep doing what you are doing.  Tomorrow talk to Dr. Wende MottMcKeag about starting back on 81mg  enteric coated aspirin with food and about possibly starting a statin (cholesterol medication) based on your lipid profile results.

## 2014-12-19 NOTE — Assessment & Plan Note (Signed)
Patient's stage of change (readiness scale): READY: Ready to set action plan and implement: NRT (gum). Behavior change readiness assessment::  action - ready to set action plan and implement NRT (gum) w/ quit date at next birthday (05/09/[birth year]). Advice given to stop smoking. Continue to quit smoking and start using Nicotine gum 4mg . Chew half a piece (2mg ) every 1-2 hours as you need it. Remember to chew and park, chew for about 5 seconds then hold the gum in the side of your mouth in order for the nicotine to get absorbed.

## 2014-12-19 NOTE — Progress Notes (Addendum)
SUBJECTIVE:   49 y.o. female who has been smoking 15 cigarettes per day for the past 34 years. She has recently decreased that to 10-12 cigarettes per day. There has/have been only a few attempts to quit and zero successes. She has tried the gum in the past and did not like it due to GI side-effects. She had also tried Wellbutrin in the past and stopped it since it made her fell unsympathetic.   QUIT DATE: 05/09/2015 (Her 49st birthday)  She is currently on nothing, but willing to try the gum to help with cravings.  The following triggers has/have been identified: Stress caused by watching the news.  Identified withdrawal symptoms: none.  She rates her stage of change as: READY: Ready to set action plan and implement: NRT (gum).  Pts HTN was controlled today at 139/77 while on Linsinopril/HCTZ 20-25mg  and Amlodipine 10mg .  Pt is in need of some lab work: Lipid Panel, BMET, and TSH levels.  OBJECTIVE: BP: 132/72 mmHg (07/18 1155) Pulse Rate: 83 (07/18 1128)  ASSESSMENT and PLAN:  Patient's stage of change (readiness scale): READY: Ready to set action plan and implement: NRT (gum). Behavior change readiness assessment::  action - ready to set action plan and implement NRT (gum) w/ quit date at next birthday (05/09/[birth year]). Advice given to stop smoking. Continue to quit smoking and start using Nicotine gum 4mg . Chew half a piece (2mg ) every 1-2 hours as you need it. Remember to chew and park, chew for about 5 seconds then hold the gum in the side of your mouth in order for the nicotine to get absorbed.  Pt is controlled on current BP medication regimen of Lisinopril/HCTZ and Amlodipine. Continue to take the Lisinopril/HCTZ 20-25mg  and Amlodipine 10mg  for BP since controlled at 139/77. Check BP again tomorrow at appointment with Dr. Wende MottMcKeag. Obtain BMET today so Dr. Wende MottMcKeag will have the results tomorrow.  Pt has never had a Lipid Panel. Obtain Lipid Panel today. Tomorrow counsel pt on starting ASA  81mg  due to family hx of heart disease. Consider initiating a statin based on the results of the lipid panel and 10-year ASCVD risk.  Pt previously used propylthiouracil 50mg  3 tabs PO BID for hyperthyroidism which was D/Ced 2 years ago. Pt has not had a TSH level taken in those past 2 years. Obtain TSH levels today. Follow up tomorrow w/ Dr. Wende MottMcKeag.  TSH - Normal Cholesterol Panel results:   LDL 102, HDL 59, Trigl 53 BMET - WNL (all normal values)  Follow up with Dr. Wende MottMcKeag - Consideration for ASA and cholesterol tx at his discretion.

## 2014-12-19 NOTE — Assessment & Plan Note (Signed)
Pt is controlled on current BP medication regimen of Lisinopril/HCTZ and Amlodipine. Continue to take the Lisinopril/HCTZ 20-25mg  and Amlodipine 10mg  for BP since controlled at 139/77. Check BP again tomorrow at appointment with Dr. Wende MottMcKeag. Obtain BMET today so Dr. Wende MottMcKeag will have the results tomorrow.

## 2014-12-19 NOTE — Assessment & Plan Note (Signed)
Pt previously used propylthiouracil 50mg  3 tabs PO BID for hyperthyroidism which was D/Ced 2 years ago. Pt has not had a TSH level taken in those past 2 years. Obtain TSH levels today. Follow up tomorrow w/ Dr. Wende MottMcKeag.

## 2014-12-19 NOTE — Patient Instructions (Signed)
We are getting some lab tests done today in order for Dr. Wende MottMcKeag to have the results for your appointment tomorrow.  Continue to quit smoking and start using Nicotine gum 4mg . Chew half a piece (2mg ) every 1-2 hours as you need it. Remember to chew and park, chew for about 5 seconds then hold the gum in the side of your mouth in order for the nicotine to get absorbed.  Continue to take your blood pressure medications. Your blood pressure has been trending down since taking those medications so continue to keep doing what you are doing.  Tomorrow talk to Dr. Wende MottMcKeag about starting back on 81mg  enteric coated aspirin with food and about possibly starting a statin (cholesterol medication) based on your lipid profile results.

## 2014-12-20 ENCOUNTER — Ambulatory Visit (INDEPENDENT_AMBULATORY_CARE_PROVIDER_SITE_OTHER): Payer: No Typology Code available for payment source | Admitting: Family Medicine

## 2014-12-20 ENCOUNTER — Encounter: Payer: Self-pay | Admitting: Family Medicine

## 2014-12-20 VITALS — BP 121/61 | HR 78 | Temp 98.3°F | Ht 62.0 in | Wt 149.0 lb

## 2014-12-20 DIAGNOSIS — L309 Dermatitis, unspecified: Secondary | ICD-10-CM

## 2014-12-20 DIAGNOSIS — Z8249 Family history of ischemic heart disease and other diseases of the circulatory system: Secondary | ICD-10-CM

## 2014-12-20 LAB — BASIC METABOLIC PANEL
BUN: 18 mg/dL (ref 6–23)
CALCIUM: 9.5 mg/dL (ref 8.4–10.5)
CHLORIDE: 100 meq/L (ref 96–112)
CO2: 27 mEq/L (ref 19–32)
CREATININE: 0.66 mg/dL (ref 0.50–1.10)
GLUCOSE: 81 mg/dL (ref 70–99)
Potassium: 3.6 mEq/L (ref 3.5–5.3)
Sodium: 139 mEq/L (ref 135–145)

## 2014-12-20 LAB — LIPID PANEL
CHOL/HDL RATIO: 2.9 ratio
CHOLESTEROL: 172 mg/dL (ref 0–200)
HDL: 59 mg/dL (ref >=46)
LDL CALC: 102 mg/dL — AB (ref 0–99)
Triglycerides: 53 mg/dL (ref <150)
VLDL: 11 mg/dL (ref 0–40)

## 2014-12-20 LAB — TSH: TSH: 0.582 u[IU]/mL (ref 0.350–4.500)

## 2014-12-20 MED ORDER — TRIAMCINOLONE ACETONIDE 0.1 % EX CREA: 1.0000 "application " | TOPICAL_CREAM | Freq: Two times a day (BID) | CUTANEOUS | Status: DC

## 2014-12-20 MED ORDER — MOMETASONE FUROATE 0.1 % EX OINT: TOPICAL_OINTMENT | Freq: Every day | CUTANEOUS | Status: DC

## 2014-12-20 MED ORDER — ATORVASTATIN CALCIUM 20 MG PO TABS: 20.0000 mg | ORAL_TABLET | Freq: Every day | ORAL | Status: DC

## 2014-12-20 MED ORDER — ASPIRIN EC 81 MG PO TBEC: 81.0000 mg | DELAYED_RELEASE_TABLET | Freq: Every day | ORAL | Status: DC

## 2014-12-20 NOTE — Patient Instructions (Signed)
It was a pleasure seeing you today in our clinic. Today we discussed your labs and smoking. Here is the treatment plan we have discussed and agreed upon together:   - I have started you on a statin, Lipitor. Please take this once a day - Please buy "Baby" Aspirin  over-the-counter and take one of these once a day. - I have refilled your skin creams for your eczema. - It was a pleasure meeting you!

## 2014-12-20 NOTE — Progress Notes (Signed)
Patient ID: Candice Evans, female   DOB: 05/05/1966, 49 y.o.   MRN: [id number] Reviewed: Agree with Dr. Macky LowerKoval's documentation and management.

## 2014-12-21 DIAGNOSIS — Z8249 Family history of ischemic heart disease and other diseases of the circulatory system: Secondary | ICD-10-CM | POA: Insufficient documentation

## 2014-12-21 NOTE — Assessment & Plan Note (Signed)
Patient c/o symptoms of an eczema flare. Patient has had this many times in the past.  - kenalog cream ordered

## 2014-12-21 NOTE — Progress Notes (Signed)
   HPI  CC: med refill Patient here for med refill and f/u after seeing Dr. Raymondo BandKoval the other day. She reports no issues since her visit w/ him earlier this week. She was interested in the results of the labs. Still plans on quitting smoking. BP is doing well today.   The only recent change is she believes she is having some eczema developing. This is something she has been treated for in the past. She endorses some vague itchiness. No drainage, pain, bleeding, fevers, chills, n/v/d.  Review of Systems   See HPI for ROS. All other systems reviewed and are negative.  Past medical history and social history reviewed and updated in the EMR as appropriate.  Objective: BP 121/61 mmHg  Pulse 78  Temp(Src) 98.3 F (36.8 C) (Oral)  Ht 5\' 2"  (1.575 m)  Wt 149 lb (67.586 kg)  BMI 27.25 kg/m2 Gen: NAD, alert, cooperative HEENT: NCAT, EOMI CV: RRR, no murmur Resp: CTAB, no wheezes, non-labored Abd: SNTND Ext: No edema, warm, some evidence of excoriations noted on Rt elbow. No swelling, lesions, or drainage appreciated. Neuro: Alert and oriented, Speech clear, No gross deficits  Assessment and plan:  Family history of cardiovascular disease Based on patient's family history, smoking history, and lipid panel patient appears to qualify for moderate statin therapy (5257yr risk ~7%). Patient agreeable to initiation of new med. Will start atorvastatin today. - atorvastatin 20mg  - ASA 81mg  started  Eczema Patient c/o symptoms of an eczema flare. Patient has had this many times in the past.  - kenalog cream ordered   Meds ordered this encounter  Medications  . mometasone (ELOCON) 0.1 % ointment    Sig: Apply topically daily. Apply to affected areas daily    Dispense:  45 g    Refill:  1  . triamcinolone cream (KENALOG) 0.1 %    Sig: Apply 1 application topically 2 (two) times daily.    Dispense:  30 g    Refill:  0  . atorvastatin (LIPITOR) 20 MG tablet    Sig: Take 1 tablet (20 mg total)  by mouth daily.    Dispense:  90 tablet    Refill:  3  . aspirin EC 81 MG tablet    Sig: Take 1 tablet (81 mg total) by mouth daily.    Dispense:  30 tablet    Refill:  0     Kathee DeltonIan D McKeag, MD,MS,  PGY2 12/21/2014 10:10 PM

## 2014-12-21 NOTE — Assessment & Plan Note (Addendum)
Based on patient's family history, smoking history, and lipid panel patient appears to qualify for moderate statin therapy (2257yr risk ~7%). Patient agreeable to initiation of new med. Will start atorvastatin today. - atorvastatin 20mg  - ASA 81mg  started

## 2015-01-05 ENCOUNTER — Other Ambulatory Visit: Payer: Self-pay | Admitting: Family Medicine

## 2015-01-05 DIAGNOSIS — I10 Essential (primary) hypertension: Secondary | ICD-10-CM

## 2015-01-05 NOTE — Telephone Encounter (Signed)
Pt calling and needs a refill on lisinopril-hydrochlorothiazide and amLODipine (NORVASc). Thank you, Dorothey Baseman, ASA

## 2015-01-06 MED ORDER — LISINOPRIL-HYDROCHLOROTHIAZIDE 20-25 MG PO TABS: 1.0000 | ORAL_TABLET | Freq: Every day | ORAL | Status: DC

## 2015-01-06 MED ORDER — AMLODIPINE BESYLATE 10 MG PO TABS: 10.0000 mg | ORAL_TABLET | Freq: Every day | ORAL | Status: DC

## 2015-01-09 ENCOUNTER — Telehealth: Payer: Self-pay | Admitting: Family Medicine

## 2015-01-09 NOTE — Telephone Encounter (Signed)
Return call to patient, she stated she was able to pick up both prescriptions.  Clovis Pu, RN

## 2015-01-09 NOTE — Telephone Encounter (Signed)
Pt called because she needs refills on her Amlodipine and Lisinopril called in. We sent this in on 8/5 to [facility] but they said they didn't receive this. Can we re-send. jw

## 2015-01-26 ENCOUNTER — Emergency Department (HOSPITAL_COMMUNITY)
Admission: EM | Admit: 2015-01-26 | Discharge: 2015-01-27 | Disposition: A | Discharge status: Home / Self Care | Payer: No Typology Code available for payment source | Attending: Emergency Medicine | Admitting: Emergency Medicine

## 2015-01-26 ENCOUNTER — Encounter (HOSPITAL_COMMUNITY): Payer: Self-pay | Admitting: *Deleted

## 2015-01-26 DIAGNOSIS — Z7982 Long term (current) use of aspirin: Secondary | ICD-10-CM | POA: Diagnosis not present

## 2015-01-26 DIAGNOSIS — K088 Other specified disorders of teeth and supporting structures: Secondary | ICD-10-CM | POA: Diagnosis not present

## 2015-01-26 DIAGNOSIS — Z8639 Personal history of other endocrine, nutritional and metabolic disease: Secondary | ICD-10-CM | POA: Insufficient documentation

## 2015-01-26 DIAGNOSIS — K0889 Other specified disorders of teeth and supporting structures: Secondary | ICD-10-CM

## 2015-01-26 DIAGNOSIS — I10 Essential (primary) hypertension: Secondary | ICD-10-CM | POA: Diagnosis not present

## 2015-01-26 DIAGNOSIS — Z872 Personal history of diseases of the skin and subcutaneous tissue: Secondary | ICD-10-CM | POA: Insufficient documentation

## 2015-01-26 DIAGNOSIS — Z720 Tobacco use: Secondary | ICD-10-CM | POA: Insufficient documentation

## 2015-01-26 DIAGNOSIS — Z79899 Other long term (current) drug therapy: Secondary | ICD-10-CM | POA: Diagnosis not present

## 2015-01-26 DIAGNOSIS — Z9889 Other specified postprocedural states: Secondary | ICD-10-CM | POA: Insufficient documentation

## 2015-01-26 DIAGNOSIS — K002 Abnormalities of size and form of teeth: Secondary | ICD-10-CM | POA: Diagnosis not present

## 2015-01-26 DIAGNOSIS — Z7952 Long term (current) use of systemic steroids: Secondary | ICD-10-CM | POA: Insufficient documentation

## 2015-01-26 MED ORDER — PENICILLIN V POTASSIUM 500 MG PO TABS
500.0000 mg | ORAL_TABLET | Freq: Once | ORAL | Status: AC
  Administered 2015-01-27: 500 mg via ORAL
  Filled 2015-01-26: qty 1

## 2015-01-26 MED ORDER — NAPROXEN 500 MG PO TABS
500.0000 mg | ORAL_TABLET | Freq: Once | ORAL | Status: AC
  Administered 2015-01-27: 500 mg via ORAL
  Filled 2015-01-26: qty 1

## 2015-01-26 NOTE — ED Provider Notes (Signed)
CSN: [id number]     Arrival date & time 01/26/15  2323 History   First MD Initiated Contact with Patient 01/26/15 2336     Chief Complaint  Patient presents with  . Dental Pain     (Consider location/radiation/quality/duration/timing/severity/associated sxs/prior Treatment) Patient is a 49 y.o. female presenting with tooth pain. The history is provided by the patient and medical records.  Dental Pain   49 year-old female with history of hypertension, hypothyroidism, eczema, presenting to the ED for left upper dental pain. Patient states her filling fell out earlier today and she has had increased pain since this time. She denies any facial or neck swelling. No difficulty swallowing. No fever or chills. Patient is not currently established with a dentist. She planned to [facility], however they do not open until tomorrow morning. She has been taking Motrin and Aleve with some improvement of pain  Past Medical History  Diagnosis Date  . HTN (hypertension)   . Hyperthyroidism   . Eczema    Past Surgical History  Procedure Laterality Date  . Cardiac catheterization      LAD 50% stenosed, no stent   Family History  Problem Relation Age of Onset  . Hypertension Father   . Coronary artery disease Father   . Heart failure Father     Died age 40  . Heart failure Brother     Died age 56  . Hypertension Mother    Social History  Substance Use Topics  . Smoking status: Current Every Day Smoker -- 0.50 packs/day for 34 years    Types: Cigarettes    Start date: 06/04/1979  . Smokeless tobacco: Never Used     Comment: wants to quit  . Alcohol Use: No   OB History    No data available     Review of Systems  HENT: Positive for dental problem.   All other systems reviewed and are negative.     Allergies  Bupropion  Home Medications   Prior to Admission medications   Medication Sig Start Date End Date Taking? Authorizing Provider  amLODipine (NORVASC)  10 MG tablet Take 1 tablet (10 mg total) by mouth daily. 01/06/15   Kathee Delton, MD  aspirin EC 81 MG tablet Take 1 tablet (81 mg total) by mouth daily. 12/20/14   Kathee Delton, MD  atorvastatin (LIPITOR) 20 MG tablet Take 1 tablet (20 mg total) by mouth daily. 12/20/14   Kathee Delton, MD  lisinopril-hydrochlorothiazide (PRINZIDE,ZESTORETIC) 20-25 MG per tablet Take 1 tablet by mouth daily. 01/06/15   Kathee Delton, MD  mometasone (ELOCON) 0.1 % ointment Apply topically daily. Apply to affected areas daily 12/20/14   Kathee Delton, MD  triamcinolone cream (KENALOG) 0.1 % Apply 1 application topically 2 (two) times daily. 12/20/14   Kathee Delton, MD   BP 116/78 mmHg  Pulse 78  Temp(Src) 98.1 F (36.7 C)  Resp 20  SpO2 99%   Physical Exam  Constitutional: She is oriented to person, place, and time. She appears well-developed and well-nourished. No distress.  HENT:  Head: Normocephalic and atraumatic.  Mouth/Throat: Uvula is midline, oropharynx is clear and moist and mucous membranes are normal. Abnormal dentition. No oropharyngeal exudate, posterior oropharyngeal edema, posterior oropharyngeal erythema or tonsillar abscesses.  Teeth largely in good dentition, left upper pre-molar with filling missing, surrounding gingiva largely normal in appearance, slight erythema noted, handling secretions appropriately, no trismus  Eyes: Conjunctivae and EOM are  normal. Pupils are equal, round, and reactive to light.  Neck: Normal range of motion. Neck supple.  Cardiovascular: Normal rate, regular rhythm and normal heart sounds.   Pulmonary/Chest: Effort normal and breath sounds normal. No respiratory distress. She has no wheezes.  Musculoskeletal: Normal range of motion.  Neurological: She is alert and oriented to person, place, and time.  Skin: Skin is warm and dry. She is not diaphoretic.  Psychiatric: She has a normal mood and affect.  Nursing note and vitals reviewed.   ED Course  Procedures (including  critical care time) Labs Review Labs Reviewed - No data to display  Imaging Review No results found.    EKG Interpretation None      MDM   Final diagnoses:  Pain, dental   49 y.o. F here with dental pain secondary to filling falling out earlier today.  No oral bleeding noted. No facial or neck swelling. No clinical concern for Ludwig's angina. Patient states she has a significant propensity for dental infections, will start on course of antibiotics. Patient will follow-up with dentist.  Discussed plan with patient, he/she acknowledged understanding and agreed with plan of care.  Return precautions given for new or worsening symptoms.  Garlon Hatchet, PA-C 01/27/15 0003  Layla Maw Ward, DO 01/27/15 1610

## 2015-01-26 NOTE — ED Notes (Signed)
Pt states that she has been having left sided dental pain since this afternoon; pt states that she thinks she had a filling fall out; pt states that she has been taking Aleve and Ibuprofen with some relief but that the pain is breaking through

## 2015-01-27 MED ORDER — PENICILLIN V POTASSIUM 500 MG PO TABS: 500.0000 mg | ORAL_TABLET | Freq: Four times a day (QID) | ORAL | Status: DC

## 2015-01-27 MED ORDER — NAPROXEN 500 MG PO TABS: 500.0000 mg | ORAL_TABLET | Freq: Two times a day (BID) | ORAL | Status: DC

## 2015-01-27 NOTE — Discharge Instructions (Signed)
Take the prescribed medication as directed. °Follow-up with dentist.  referral and resource guide provided. °Return to the ED for new or worsening symptoms. ° ° °Emergency Department Resource Guide °1) Find a Doctor and Pay Out of Pocket °Although you won't have to find out who is covered by your insurance plan, it is a good idea to ask around and get recommendations. You will then need to call the office and see if the doctor you have chosen will accept you as a new patient and what types of options they offer for patients who are self-pay. Some doctors offer discounts or will set up payment plans for their patients who do not have insurance, but you will need to ask so you aren't surprised when you get to your appointment. ° °2) Contact Your Local Health Department °Not all health departments have doctors that can see patients for sick visits, but many do, so it is worth a call to see if yours does. If you don't know where your local health department is, you can check in your phone book. The CDC also has a tool to help you locate your state's health department, and many state websites also have listings of all of their local health departments. ° °3) Find a Walk-in Clinic °If your illness is not likely to be very severe or complicated, you may want to try a walk in clinic. These are popping up all over the country in pharmacies, drugstores, and shopping centers. They're usually staffed by nurse practitioners or physician assistants that have been trained to treat common illnesses and complaints. They're usually fairly quick and inexpensive. However, if you have serious medical issues or chronic medical problems, these are probably not your best option. ° °No Primary Care Doctor: °- Call Health Connect at  832-8000 - they can help you locate a primary care doctor that  accepts your insurance, provides certain services, etc. °- Physician Referral Service- 1-800-533-3463 ° °Chronic Pain Problems: °Organization          Address  Phone   Notes  °[facility]  ([phone]) [phone] Patients need to be referred by their primary care doctor.  ° °Medication Assistance: °Organization         Address  Phone   Notes  °[place] [facility] [address] °[place], [place] [address] (336) 641-8030 --Must be a resident of [place] °-- Must have NO insurance coverage whatsoever (no Medicaid/ Medicare, etc.) °-- The pt. MUST have a primary care doctor that directs their care regularly and follows them in the community °  °[facility]  (866) 331-1348   °[employer]  (888) 892-1162   ° °Agencies that provide inexpensive medical care: °Organization         Address  Phone   Notes  °[facility]  (336) 832-8035   °[facility]    (336) 832-7272   °[facility] [address] °[place], [place] [address] (336) 832-4777   °[facility] [address], °[place] (336) 271-4999   °[facility]    (336) 373-0678   °[facility]    (336) 272-1050   °[facility] ° [address], [place] Phone:  (336) 832-4444, Fax:  (336) 832-4440 Hours of Operation:  9 am - 6 pm, M-F.  Also accepts Medicaid/Medicare and self-pay.  °[facility] ° [address], [place] Phone: (  336) 832-3150, Fax: (336) [phone]. Hours of Operation:  8:30 am - 5:30 pm, M-F.  Also accepts Medicaid and self-pay.  °[facility] [place] [address], [place] Phone: (336) 878-6027   °[facility] [address], [place], [place] (336)723-1848, Ext. 123 Mondays & Thursdays: 7-9 AM.  First 15 patients are seen on a first come, first serve basis. °  ° °Medicaid-accepting [place] Providers: ° °Organization         Address  Phone   Notes  °[facility] [address], [place] (336) [phone] Also accepts self-pay patients.    °[facility] [address], [place] ° (336) 856-9996   °[facility] [address], [place] (336) 288-8857   °[facility] [address] [place] [address], [place] (336) 299-7000   °Veita Bland [address], [place]  ° (336) 373-1557 Only accepts [place] Access Medicaid patients after they have their name applied to their card.  ° °Self-Pay (no insurance) in [place]: ° °Organization         Address  Phone   Notes  °Sickle Cell Patients, [facility] [address], [place] (336) 832-1970   °[facility] [address], [place] (336) 832-4400   °[facility] ° [address], [place] (336) 992-4800   °[facility]/Dr. Osei-Bonsu ° [address] [place] [address], [place] or [address], [place] (336) [phone] Phone number for both [place] and [place] locations is the same.  °[facility] [address], [place] (336) 299-0000   °[facility] [address] [place] [address], [place] or [address] (336) 852-7530 °(336) 878-2260   °[facility] [address], [place] (336) 350-1642, phone; (336) 294-5005, fax Sees patients 1st and 3rd Saturday of every month.  Must not qualify for public or private insurance (i.e. Medicaid, Medicare, [place] Health Choice, Veterans' Benefits) • Household income should be no more than 200% of the poverty level •The clinic cannot treat you if you are pregnant or think you are pregnant • Sexually transmitted diseases are not treated at the clinic.  ° ° °Dental Care: °Organization         Address  Phone  Notes  °[place] [facility] [address], [place] (336) 641-6152 Accepts children up to age 21 who are enrolled in Medicaid or [place] Health Choice; pregnant women with a Medicaid  card; and children who have applied for Medicaid or [place] Health Choice, but were declined, whose parents can pay a reduced fee at time of service.  °[place] [facility] [place]  [address], [place] (336) 641-7733 Accepts children up to age 21 who are enrolled in Medicaid or [place] Health Choice; pregnant women with a Medicaid card; and children who have applied for Medicaid or  Health Choice, but were declined, whose parents can pay a reduced fee at time of service.  °[facility] ° [address], [place] (336) 641-4533 Patients are seen by appointment only. Walk-ins are not accepted. [facility] will see patients 18 years of age and older. °Monday - Tuesday (8am-5pm) °Most Wednesdays (8:30-5pm) °$30 per visit, cash only  °[facility] ° [address]   [place] [phone]) [phone] Patients are seen by appointment only. Walk-ins are not accepted. [facility] will see patients 51 years of age and older. One Wednesday Evening (Monthly: Volunteer Based).  $30 per visit, cash only  [facility]  ([phone] for adults; Children under age 364, call Graduate Pediatric Dentistry at [phone]) [phone]. Children aged 134-14, please call [phone] to request a pediatric application.  Dental services are provided in all areas of dental care including fillings, crowns and bridges, complete and partial dentures, implants, gum treatment, root canals, and extractions. Preventive care is also provided. Treatment is provided to both adults and children. Patients are selected via a lottery and there is often a waiting list.   [facility] [address], [place]  [phone]) [phone] [email or web address]   [facility] [address], [place], [place] [phone])([phone], Ext. 123 Second and Fourth Thursday of each month, opens at 6:30 AM; Clinic ends at 9 AM.  Patients are seen on a first-come  first-served basis, and a limited number are seen during each clinic.   [facility]  [address], [place], [place] [phone]) [phone]   Eligibility Requirements You must have lived in [place], [place], or [place] counties for at least the last three months.   You cannot be eligible for state or federal sponsored [place]healthcare insurance, including CIGNAVeterans Administration, [place]icaid, or [employer]icare.   You generally cannot be eligible for healthcare insurance through your employer.    How to apply: Eligibility screenings are held every Tuesday and Wednesday afternoon from 1:00 pm until 4:00 pm. You do not need an appointment for the interview!  [facility] [address], [place], [place] [phone]   [facility]  [phone]   [facility]  [phone]   [facility]  ([phone]    Behavioral Health Resources in the Community: Intensive Outpatient Programs Organization         Address  Phone  Notes  [facility] [address], [place], [place] [phone]   [facility] [address], [place], [place] [phone]   [facility] [address], [place], [place]  [phone]   [facility] [address],  [place], [place] [phone] or [phone]   Substance Abuse Resources Organization         Address  Phone  Notes  [facility]  [phone]   [facility]  ([phone]   [facility]  [phone]   [facility]  [phone]   Residential & Outpatient Substance Abuse Program  [phone]   Psychological Services Organization         Address  Phone  Notes  [facility]  [phone]) [phone]- [phone]   [facility]  [phone]- [phone]   [facility] [address], [place] [phone] or [phone]    Mobile Crisis Teams Organization          Address  Phone  Notes  [facility], Mobile Crisis Care Unit  [phone]   [facility]  [address] [place], [place] [phone]   Doristine LocksSharon DeEsch [address] [place] [phone]    Self-Help/Support Groups Organization         Address  Phone             Notes  [facility] [place] - variety of support  groups  [phone]- [phone] Call for more information  Narcotics Anonymous (NA), [facility] [address], [place]  2 meetings at this location   Residential Facilities manager         Address  Phone  Notes  [facility] [place],    [place]  [phone]   [facility]  [address], [place] [id number], [place], [place]   [place], [place] [phone] Admissions: 8am-3pm M-F  [facility]cent[facility] Substance [place] [address]    [place], [place] [phone]   [facility] [address] [place], [place], [place]   [facility] [address]  [place], [place]   [facility] - Intensive Outpatient [address], [place], [place]   [facility] [address]  [place], [place] [phone]-([phone] or [phone]   [facility] ([facility] [address] [place], [place] Accepts Medicaid  [facility] [place] [address]  [place] [phone] Substance Abuse/Addiction Treatment   [facility]         Address  Phone  Notes  [facility]  [phone]   Domenic Schwab, PhD [address]lis [place], [place]   [phone] or [phone]   [facility] [place], [place] [phone]   [facility] [address] [place], [place] [phone] Insurance/Medicaid/sponsorship  through [facility] and Families [address]                                    [place], [place] [phone] [place] [address], [place] [phone]    Dr. Adele Schilder  [phone]   [facility] Dept. 1) [address] [place] 2) [place] 3)  [address], [place] [phone]  [phone]   [place] [phone] or [phone] (After Hours)

## 2015-02-07 ENCOUNTER — Other Ambulatory Visit: Payer: Self-pay | Admitting: Family Medicine

## 2015-02-07 NOTE — Telephone Encounter (Signed)
Patient would like to have refills given for multiple times on her amlodipine,  instead of having to contact pharmacy each month for it.  Please give more refills added to new rx.

## 2015-02-08 ENCOUNTER — Telehealth: Payer: Self-pay | Admitting: Family Medicine

## 2015-02-08 NOTE — Telephone Encounter (Signed)
error 

## 2015-03-10 ENCOUNTER — Other Ambulatory Visit: Payer: Self-pay | Admitting: Family Medicine

## 2015-03-10 DIAGNOSIS — I10 Essential (primary) hypertension: Secondary | ICD-10-CM

## 2015-03-10 MED ORDER — LISINOPRIL-HYDROCHLOROTHIAZIDE 20-25 MG PO TABS: 1.0000 | ORAL_TABLET | Freq: Every day | ORAL | Status: DC

## 2015-03-10 NOTE — Telephone Encounter (Signed)
Pt called and needs a refill on her Lisinopril called in. jw °

## 2015-06-02 ENCOUNTER — Encounter: Payer: Self-pay | Admitting: Family Medicine

## 2015-06-02 ENCOUNTER — Ambulatory Visit (INDEPENDENT_AMBULATORY_CARE_PROVIDER_SITE_OTHER): Payer: No Typology Code available for payment source | Admitting: Family Medicine

## 2015-06-02 VITALS — BP 145/70 | HR 68 | Temp 98.5°F | Wt 151.3 lb

## 2015-06-02 DIAGNOSIS — B9689 Other specified bacterial agents as the cause of diseases classified elsewhere: Secondary | ICD-10-CM

## 2015-06-02 DIAGNOSIS — J019 Acute sinusitis, unspecified: Secondary | ICD-10-CM | POA: Diagnosis not present

## 2015-06-02 MED ORDER — AMOXICILLIN-POT CLAVULANATE 875-125 MG PO TABS: 1.0000 | ORAL_TABLET | Freq: Two times a day (BID) | ORAL | Status: DC

## 2015-06-02 MED ORDER — FLUTICASONE PROPIONATE 50 MCG/ACT NA SUSP: 2.0000 | Freq: Every day | NASAL | Status: DC

## 2015-06-02 NOTE — Progress Notes (Signed)
    Subjective   Candice Evans is a 49 y.o. female that presents for a same day visit  1. Congestion: Symptoms started about two weeks ago. She has congestion in her nose and chest. She has some burning in her nose. Phlegm is yellowish green. She has an associated cough, sore throat, sneezing. She has a tmax of 101 degrees farenheit yesterday with no associated nausea or vomiting. She has tried Mucinex, Theraflu, which have not helped. Overall, symptoms have started to worsen. Sick contacts include her grandson and her mother. 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  ROS Per HPI  Social History  Substance Use Topics  . Smoking status: Current Every Day Smoker -- 0.50 packs/day for 34 years    Types: Cigarettes    Start date: 06/04/1979  . Smokeless tobacco: Never Used     Comment: wants to quit  . Alcohol Use: No    Allergies  Allergen Reactions  . Bupropion Other (See Comments)    Pt said it made her unsympathetic    Objective   BP 145/70 mmHg  Pulse 68  Temp(Src) 98.5 F (36.9 C) (Oral)  Wt 151 lb 4.8 oz (68.629 kg)  General: Well appearing, no distress HEENT:   Head:  Normocephalic, maxillary tenderness  Eyes: Pupils equal and reactive to light/accomodation. Extraocular movements intact bilaterally.  Ears: Tympanic membranes normal bilaterally.  Nose/Throat: Nares patent bilaterally. Oropharnx clear and moist.  Neck: No cervical adenopathy bilaterally Respiratory/Chest: Clear to auscultation bilaterally. Unlabored work of breathing. No wheezing or rales.  Assessment and Plan   Meds ordered this encounter  Medications  . fluticasone (FLONASE) 50 MCG/ACT nasal spray    Sig: Place 2 sprays into both nostrils daily.    Dispense:  16 g    Refill:  6  . amoxicillin-clavulanate (AUGMENTIN) 875-125 MG tablet    Sig: Take 1 tablet by mouth 2 (two) times daily.    Dispense:  20 tablet    Refill:  0    Acute bacterial sinusitis - Augmentin 1 BID x10  days - Flonase for congestion - Follow-up if symptoms worsen or fail to improve

## 2015-06-02 NOTE — Patient Instructions (Addendum)
Thank you for coming to see me today. It was a pleasure. Today we talked about:   Acute bacterial sinusitis: I will treat you with antibiotics for this issue. I will also prescribe an nasal steroid  Please make an appointment to see Dr. Wende MottMckeag for follow-up of chronic issues and healthcare maintenance.  If you have any questions or concerns, please do not hesitate to call the office at [phone].  Sincerely,  Jacquelin Hawkingalph Dorlene Footman, MD  Sinusitis, Adult Sinusitis is redness, soreness, and inflammation of the paranasal sinuses. Paranasal sinuses are air pockets within the bones of your face. They are located beneath your eyes, in the middle of your forehead, and above your eyes. In healthy paranasal sinuses, mucus is able to drain out, and air is able to circulate through them by way of your nose. However, when your paranasal sinuses are inflamed, mucus and air can become trapped. This can allow bacteria and other germs to grow and cause infection. Sinusitis can develop quickly and last only a short time (acute) or continue over a long period (chronic). Sinusitis that lasts for more than 12 weeks is considered chronic. CAUSES Causes of sinusitis include:  Allergies.  Structural abnormalities, such as displacement of the cartilage that separates your nostrils (deviated septum), which can decrease the air flow through your nose and sinuses and affect sinus drainage.  Functional abnormalities, such as when the small hairs (cilia) that line your sinuses and help remove mucus do not work properly or are not present. SIGNS AND SYMPTOMS Symptoms of acute and chronic sinusitis are the same. The primary symptoms are pain and pressure around the affected sinuses. Other symptoms include:  Upper toothache.  Earache.  Headache.  Bad breath.  Decreased sense of smell and taste.  A cough, which worsens when you are lying flat.  Fatigue.  Fever.  Thick drainage from your nose, which often is  green and may contain pus (purulent).  Swelling and warmth over the affected sinuses. DIAGNOSIS Your health care provider will perform a physical exam. During your exam, your health care provider may perform any of the following to help determine if you have acute sinusitis or chronic sinusitis:  Look in your nose for signs of abnormal growths in your nostrils (nasal polyps).  Tap over the affected sinus to check for signs of infection.  View the inside of your sinuses using an imaging device that has a light attached (endoscope). If your health care provider suspects that you have chronic sinusitis, one or more of the following tests may be recommended:  Allergy tests.  Nasal culture. A sample of mucus is taken from your nose, sent to a lab, and screened for bacteria.  Nasal cytology. A sample of mucus is taken from your nose and examined by your health care provider to determine if your sinusitis is related to an allergy. TREATMENT Most cases of acute sinusitis are related to a viral infection and will resolve on their own within 10 days. Sometimes, medicines are prescribed to help relieve symptoms of both acute and chronic sinusitis. These may include pain medicines, decongestants, nasal steroid sprays, or saline sprays. However, for sinusitis related to a bacterial infection, your health care provider will prescribe antibiotic medicines. These are medicines that will help kill the bacteria causing the infection. Rarely, sinusitis is caused by a fungal infection. In these cases, your health care provider will prescribe antifungal medicine. For some cases of chronic sinusitis, surgery is needed. Generally, these are cases in which  sinusitis recurs more than 3 times per year, despite other treatments. HOME CARE INSTRUCTIONS  Drink plenty of water. Water helps thin the mucus so your sinuses can drain more easily.  Use a humidifier.  Inhale steam 3-4 times a day (for example, sit in the  bathroom with the shower running).  Apply a warm, moist washcloth to your face 3-4 times a day, or as directed by your health care provider.  Use saline nasal sprays to help moisten and clean your sinuses.  Take medicines only as directed by your health care provider.  If you were prescribed either an antibiotic or antifungal medicine, finish it all even if you start to feel better. SEEK IMMEDIATE MEDICAL CARE IF:  You have increasing pain or severe headaches.  You have nausea, vomiting, or drowsiness.  You have swelling around your face.  You have vision problems.  You have a stiff neck.  You have difficulty breathing.   This information is not intended to replace advice given to you by your health care provider. Make sure you discuss any questions you have with your health care provider.   Document Released: 05/20/2005 Document Revised: 06/10/2014 Document Reviewed: 06/04/2011 Elsevier Interactive Patient Education [employer]

## 2015-07-12 ENCOUNTER — Other Ambulatory Visit: Payer: Self-pay | Admitting: Family Medicine

## 2015-07-12 DIAGNOSIS — L309 Dermatitis, unspecified: Secondary | ICD-10-CM

## 2015-07-12 MED ORDER — AMLODIPINE BESYLATE 10 MG PO TABS: 10.0000 mg | ORAL_TABLET | Freq: Every day | ORAL | Status: DC

## 2015-07-12 MED ORDER — MOMETASONE FUROATE 0.1 % EX OINT: TOPICAL_OINTMENT | Freq: Every day | CUTANEOUS | Status: DC

## 2015-07-12 NOTE — Telephone Encounter (Signed)
Pt called and needs a refill on her Amlodipine and Mometasone called in. jw

## 2015-08-17 ENCOUNTER — Ambulatory Visit (INDEPENDENT_AMBULATORY_CARE_PROVIDER_SITE_OTHER): Payer: No Typology Code available for payment source | Admitting: Family Medicine

## 2015-08-17 ENCOUNTER — Encounter: Payer: Self-pay | Admitting: Family Medicine

## 2015-08-17 VITALS — BP 145/74 | HR 70 | Temp 97.4°F | Wt 151.9 lb

## 2015-08-17 DIAGNOSIS — J019 Acute sinusitis, unspecified: Secondary | ICD-10-CM

## 2015-08-17 DIAGNOSIS — B9689 Other specified bacterial agents as the cause of diseases classified elsewhere: Secondary | ICD-10-CM

## 2015-08-17 MED ORDER — AMOXICILLIN-POT CLAVULANATE 875-125 MG PO TABS: 1.0000 | ORAL_TABLET | Freq: Two times a day (BID) | ORAL | Status: DC

## 2015-08-17 NOTE — Patient Instructions (Signed)
Make sure you keep your hands clean, try not to touch your face, cough and sneeze into your elbows   The best thing you can do for yourself is quitting smoking! Especially when you are sick the smoke will decrease your body's immune system and ability to fight infection.  If you need help quitting we are more than happy to try and help here at the clinic, just schedule an appointment with your regular doctor to discuss.

## 2015-08-17 NOTE — Progress Notes (Signed)
   Subjective:    Patient ID: Candice Evans, female    DOB: 09/22/1965, 50 y.o.   MRN: [id number]  HPI  Patient presents for Same Day Appointment  CC: sinus infection  # Sinus infection:  Last had one in December, improved with augmentin  Most recently started again 2-3 weeks ago. Symptoms are worsening  Severe nasal congestion, blowing out yellow/green mucous  Lots of pressure in all sinuses  Burning pain when blowing nose  Also has a non-productive cough, no sore throat, ear fullness but not pain  Has tried OTC mucinex DM which didn't help  No fevers  Social Hx: current smoker, trying to quit, used some chantix but stopped  Review of Systems   See HPI for ROS.   Past medical history, surgical, family, and social history reviewed and updated in the EMR as appropriate.  Objective:  BP 145/74 mmHg  Pulse 70  Temp(Src) 97.4 F (36.3 C) (Oral)  Wt 151 lb 14.4 oz (68.901 kg) Vitals and nursing note reviewed  General: no apparent distress  Eyes: PERRL, EOMI, normal conjunctiva and sclera. ENTM: TMs slightly dulled light reflex bilaterally, no erythema. Nasal turbinates swollen and erythematous bilaterally, rhinorrhea present both nares. There is tenderness to percussion of frontal, maxillary sinuses bilaterally. Posterior pharynx clear/no erythema. CV: normal rate, regular rhythm, no murmurs, rubs or gallop Resp: clear to auscultation bilaterally, normal effort  Assessment & Plan:   1. Acute bacterial sinusitis Viral vs recurrent bacterial sinusitis. With tenderness and duration, worsening symptoms will elect to treat as bacterial sinusitis. Last antibiotics 3 months ago. Discussed hand hygiene, OTC medications. Follow up if not improving after antibiotic course. - amoxicillin-clavulanate (AUGMENTIN) 875-125 MG tablet; Take 1 tablet by mouth 2 (two) times daily.  Dispense: 14 tablet; Refill: 0

## 2016-01-08 ENCOUNTER — Other Ambulatory Visit: Payer: Self-pay | Admitting: *Deleted

## 2016-01-08 DIAGNOSIS — L309 Dermatitis, unspecified: Secondary | ICD-10-CM

## 2016-01-08 MED ORDER — TRIAMCINOLONE ACETONIDE 0.1 % EX CREA: 1.0000 "application " | TOPICAL_CREAM | Freq: Two times a day (BID) | CUTANEOUS | 0 refills | Status: DC

## 2016-05-21 ENCOUNTER — Other Ambulatory Visit: Payer: Self-pay | Admitting: Family Medicine

## 2016-05-21 DIAGNOSIS — L309 Dermatitis, unspecified: Secondary | ICD-10-CM

## 2016-05-21 NOTE — Telephone Encounter (Signed)
Would like a refill on mometasone. Please advise. Sunday SpillersSharon T Saunders, CMA

## 2016-05-29 ENCOUNTER — Ambulatory Visit (INDEPENDENT_AMBULATORY_CARE_PROVIDER_SITE_OTHER): Payer: No Typology Code available for payment source | Admitting: Family Medicine

## 2016-05-29 ENCOUNTER — Encounter: Payer: Self-pay | Admitting: Family Medicine

## 2016-05-29 DIAGNOSIS — Z716 Tobacco abuse counseling: Secondary | ICD-10-CM | POA: Diagnosis not present

## 2016-05-29 MED ORDER — VARENICLINE TARTRATE 0.5 MG X 11 & 1 MG X 42 PO MISC: ORAL | 0 refills | Status: DC

## 2016-05-29 MED ORDER — NICOTINE POLACRILEX 4 MG MT LOZG: 4.0000 mg | LOZENGE | OROMUCOSAL | 1 refills | Status: DC | PRN

## 2016-05-29 NOTE — Progress Notes (Signed)
   HPI  CC: Smoking cessation Patient is here for advice on smoking cessation. She states that she is ready to quit smoking. Her employer has integrated new rules into her work space as she is now no longer allowed to smoke on the campus of her employment and must drive away in order to have a cigarette. She was also informed that she must pay an additional $25 a month for health insurance if she is an active smoker.  Patient states that she has been smoking since she was 50 years old. She currently smokes a half pack a day and this is the highest amount of cigarettes a day she has ever smoked. She has attempted to quit in the past been successful for a total of 3 months. She states that she failed due to smoking marijuana one night which she states "goes really well with cigarettes".  She denies any fatigue, weight loss, fever, chills, blurry vision, dizziness, shortness of breath, chest pain, nausea, vomiting, diarrhea.  Review of Systems    See HPI for ROS. All other systems reviewed and are negative.  CC, SH/smoking status, and VS noted  Objective: BP (!) 142/80   Pulse 75   Temp 98.2 F (36.8 C) (Oral)   Wt 158 lb (71.7 kg)   BMI 28.90 kg/m  Gen: NAD, alert, cooperative, and pleasant. CV: RRR, no murmur Resp: CTAB, no wheezes, non-labored   Assessment and plan:  Tobacco abuse counseling Patient is here for counseling on smoking cessation. Patient states she is ready to quit. We spends much of our discussion determining a quit technique and date. - Quit date: Unknown at this time, likely 1-2 weeks from now, but possibly February 1. - Quit technique: Patient believes quitting "cold [place]" would likely be the best. If she tapers her smoking use then she would use February 1 as her quit date. - Chantix initiated today - Nicotine replacement: Nicotine lozenges. - Follow-up: Patient to follow-up with me 1-2 weeks after 100% quit date. (Should consider refill on Chantix  prescription for 2 additional months at that time.)   Meds ordered this encounter  Medications  . varenicline (CHANTIX STARTING MONTH PAK) 0.5 MG X 11 & 1 MG X 42 tablet    Sig: Take one 0.5 mg tablet by mouth once daily for 3 days, then increase to one 0.5 mg tablet twice daily for 4 days, then increase to one 1 mg tablet twice daily.    Dispense:  53 tablet    Refill:  0  . nicotine polacrilex (EQ NICOTINE) 4 MG lozenge    Sig: Take 1 lozenge (4 mg total) by mouth as needed for smoking cessation.    Dispense:  100 tablet    Refill:  1     Kathee DeltonIan D Deyton Ellenbecker, MD,MS,  PGY3 05/29/2016 1:48 PM

## 2016-05-29 NOTE — Assessment & Plan Note (Signed)
Patient is here for counseling on smoking cessation. Patient states she is ready to quit. We spends much of our discussion determining a quit technique and date. - Quit date: Unknown at this time, likely 1-2 weeks from now, but possibly February 1. - Quit technique: Patient believes quitting "cold [place]" would likely be the best. If she tapers her smoking use then she would use February 1 as her quit date. - Chantix initiated today - Nicotine replacement: Nicotine lozenges. - Follow-up: Patient to follow-up with me 1-2 weeks after 100% quit date. (Should consider refill on Chantix prescription for 2 additional months at that time.)

## 2016-05-29 NOTE — Patient Instructions (Signed)
Steps to Quit Smoking Smoking tobacco can be harmful to your health and can affect almost every organ in your body. Smoking puts you, and those around you, at risk for developing many serious chronic diseases. Quitting smoking is difficult, but it is one of the best things that you can do for your health. It is never too late to quit. What are the benefits of quitting smoking? When you quit smoking, you lower your risk of developing serious diseases and conditions, such as:  Lung cancer or lung disease, such as COPD.  Heart disease.  Stroke.  Heart attack.  Infertility.  Osteoporosis and bone fractures.  Additionally, symptoms such as coughing, wheezing, and shortness of breath may get better when you quit. You may also find that you get sick less often because your body is stronger at fighting off colds and infections. If you are pregnant, quitting smoking can help to reduce your chances of having a baby of low birth weight. How do I get ready to quit? When you decide to quit smoking, create a plan to make sure that you are successful. Before you quit:  Pick a date to quit. Set a date within the next two weeks to give you time to prepare.  Write down the reasons why you are quitting. Keep this list in places where you will see it often, such as on your bathroom mirror or in your car or wallet.  Identify the people, places, things, and activities that make you want to smoke (triggers) and avoid them. Make sure to take these actions: ? Throw away all cigarettes at home, at work, and in your car. ? Throw away smoking accessories, such as ashtrays and lighters. ? Clean your car and make sure to empty the ashtray. ? Clean your home, including curtains and carpets.  Tell your family, friends, and coworkers that you are quitting. Support from your loved ones can make quitting easier.  Talk with your health care provider about your options for quitting smoking.  Find out what treatment  options are covered by your health insurance.  What strategies can I use to quit smoking? Talk with your healthcare provider about different strategies to quit smoking. Some strategies include:  Quitting smoking altogether instead of gradually lessening how much you smoke over a period of time. Research shows that quitting "cold turkey" is more successful than gradually quitting.  Attending in-person counseling to help you build problem-solving skills. You are more likely to have success in quitting if you attend several counseling sessions. Even short sessions of 10 minutes can be effective.  Finding resources and support systems that can help you to quit smoking and remain smoke-free after you quit. These resources are most helpful when you use them often. They can include: ? Online chats with a counselor. ? Telephone quitlines. ? Printed self-help materials. ? Support groups or group counseling. ? Text messaging programs. ? Mobile phone applications.  Taking medicines to help you quit smoking. (If you are pregnant or breastfeeding, talk with your health care provider first.) Some medicines contain nicotine and some do not. Both types of medicines help with cravings, but the medicines that include nicotine help to relieve withdrawal symptoms. Your health care provider may recommend: ? Nicotine patches, gum, or lozenges. ? Nicotine inhalers or sprays. ? Non-nicotine medicine that is taken by mouth.  Talk with your health care provider about combining strategies, such as taking medicines while you are also receiving in-person counseling. Using these two strategies together   makes you more likely to succeed in quitting than if you used either strategy on its own. If you are pregnant or breastfeeding, talk with your health care provider about finding counseling or other support strategies to quit smoking. Do not take medicine to help you quit smoking unless told to do so by your health care  provider. What things can I do to make it easier to quit? Quitting smoking might feel overwhelming at first, but there is a lot that you can do to make it easier. Take these important actions:  Reach out to your family and friends and ask that they support and encourage you during this time. Call telephone quitlines, reach out to support groups, or work with a counselor for support.  Ask people who smoke to avoid smoking around you.  Avoid places that trigger you to smoke, such as bars, parties, or smoke-break areas at work.  Spend time around people who do not smoke.  Lessen stress in your life, because stress can be a smoking trigger for some people. To lessen stress, try: ? Exercising regularly. ? Deep-breathing exercises. ? Yoga. ? Meditating. ? Performing a body scan. This involves closing your eyes, scanning your body from head to toe, and noticing which parts of your body are particularly tense. Purposefully relax the muscles in those areas.  Download or purchase mobile phone or tablet apps (applications) that can help you stick to your quit plan by providing reminders, tips, and encouragement. There are many free apps, such as QuitGuide from the CDC (Centers for Disease Control and Prevention). You can find other support for quitting smoking (smoking cessation) through smokefree.gov and other websites.  How will I feel when I quit smoking? Within the first 24 hours of quitting smoking, you may start to feel some withdrawal symptoms. These symptoms are usually most noticeable 2-3 days after quitting, but they usually do not last beyond 2-3 weeks. Changes or symptoms that you might experience include:  Mood swings.  Restlessness, anxiety, or irritation.  Difficulty concentrating.  Dizziness.  Strong cravings for sugary foods in addition to nicotine.  Mild weight gain.  Constipation.  Nausea.  Coughing or a sore throat.  Changes in how your medicines work in your  body.  A depressed mood.  Difficulty sleeping (insomnia).  After the first 2-3 weeks of quitting, you may start to notice more positive results, such as:  Improved sense of smell and taste.  Decreased coughing and sore throat.  Slower heart rate.  Lower blood pressure.  Clearer skin.  The ability to breathe more easily.  Fewer sick days.  Quitting smoking is very challenging for most people. Do not get discouraged if you are not successful the first time. Some people need to make many attempts to quit before they achieve long-term success. Do your best to stick to your quit plan, and talk with your health care provider if you have any questions or concerns. This information is not intended to replace advice given to you by your health care provider. Make sure you discuss any questions you have with your health care provider. Document Released: 05/14/2001 Document Revised: 01/16/2016 Document Reviewed: 10/04/2014 Elsevier Interactive Patient Education  2017 [employer]  

## 2016-06-28 ENCOUNTER — Other Ambulatory Visit: Payer: Self-pay | Admitting: Family Medicine

## 2016-06-28 MED ORDER — NICOTINE POLACRILEX 4 MG MT LOZG: 4.0000 mg | LOZENGE | OROMUCOSAL | 1 refills | Status: DC | PRN

## 2016-06-28 NOTE — Telephone Encounter (Signed)
Chantix cannot be e-prescribed (This is why it is so important that patient follows up 1-2 weeks after quit date!) Patient needs an appointment!  Great job! But my hands are tied!   Please get her in to see me ASAP. Double book if necessary.  Rx for lozenge placed.

## 2016-06-28 NOTE — Telephone Encounter (Signed)
Stopped smoking 06-12-16.  Need another Rx for Chantax and half prescription of the nicotine lozenges.  [facility] [address] of [address]

## 2016-07-02 ENCOUNTER — Other Ambulatory Visit: Payer: Self-pay | Admitting: Family Medicine

## 2016-07-03 NOTE — Telephone Encounter (Signed)
chantix is controlled. As I asked someone earlier this week. PLEASE get this patient in to see me

## 2016-07-03 NOTE — Telephone Encounter (Signed)
Appointment made for 07-04-16. ep

## 2016-07-04 ENCOUNTER — Ambulatory Visit (INDEPENDENT_AMBULATORY_CARE_PROVIDER_SITE_OTHER): Payer: No Typology Code available for payment source | Admitting: Family Medicine

## 2016-07-04 ENCOUNTER — Encounter: Payer: Self-pay | Admitting: Family Medicine

## 2016-07-04 DIAGNOSIS — Z716 Tobacco abuse counseling: Secondary | ICD-10-CM | POA: Insufficient documentation

## 2016-07-04 DIAGNOSIS — Z720 Tobacco use: Secondary | ICD-10-CM

## 2016-07-04 MED ORDER — VARENICLINE TARTRATE 1 MG PO TABS
1.0000 mg | ORAL_TABLET | Freq: Two times a day (BID) | ORAL | 1 refills | Status: DC
Start: 2016-07-04 — End: 2017-07-01

## 2016-07-04 NOTE — Assessment & Plan Note (Signed)
Quit date: 06/12/16 Medications: Chantix, nicotine lozenges Patient feels as though her decision to quit smoking was a good choice. She has been doing well and has been smoking free since 06/12/16. Patient like to continue the Chantix for another 2 months. Patient is also pleased with the throat lozenges and would like to continue these as well. - Refilled Chantix, and nicotine lozenges - Follow-up in 2 months

## 2016-07-04 NOTE — Patient Instructions (Signed)
Steps to Quit Smoking Smoking tobacco can be harmful to your health and can affect almost every organ in your body. Smoking puts you, and those around you, at risk for developing many serious chronic diseases. Quitting smoking is difficult, but it is one of the best things that you can do for your health. It is never too late to quit. What are the benefits of quitting smoking? When you quit smoking, you lower your risk of developing serious diseases and conditions, such as:  Lung cancer or lung disease, such as COPD.  Heart disease.  Stroke.  Heart attack.  Infertility.  Osteoporosis and bone fractures.  Additionally, symptoms such as coughing, wheezing, and shortness of breath may get better when you quit. You may also find that you get sick less often because your body is stronger at fighting off colds and infections. If you are pregnant, quitting smoking can help to reduce your chances of having a baby of low birth weight. How do I get ready to quit? When you decide to quit smoking, create a plan to make sure that you are successful. Before you quit:  Pick a date to quit. Set a date within the next two weeks to give you time to prepare.  Write down the reasons why you are quitting. Keep this list in places where you will see it often, such as on your bathroom mirror or in your car or wallet.  Identify the people, places, things, and activities that make you want to smoke (triggers) and avoid them. Make sure to take these actions: ? Throw away all cigarettes at home, at work, and in your car. ? Throw away smoking accessories, such as ashtrays and lighters. ? Clean your car and make sure to empty the ashtray. ? Clean your home, including curtains and carpets.  Tell your family, friends, and coworkers that you are quitting. Support from your loved ones can make quitting easier.  Talk with your health care provider about your options for quitting smoking.  Find out what treatment  options are covered by your health insurance.  What strategies can I use to quit smoking? Talk with your healthcare provider about different strategies to quit smoking. Some strategies include:  Quitting smoking altogether instead of gradually lessening how much you smoke over a period of time. Research shows that quitting "cold turkey" is more successful than gradually quitting.  Attending in-person counseling to help you build problem-solving skills. You are more likely to have success in quitting if you attend several counseling sessions. Even short sessions of 10 minutes can be effective.  Finding resources and support systems that can help you to quit smoking and remain smoke-free after you quit. These resources are most helpful when you use them often. They can include: ? Online chats with a counselor. ? Telephone quitlines. ? Printed self-help materials. ? Support groups or group counseling. ? Text messaging programs. ? Mobile phone applications.  Taking medicines to help you quit smoking. (If you are pregnant or breastfeeding, talk with your health care provider first.) Some medicines contain nicotine and some do not. Both types of medicines help with cravings, but the medicines that include nicotine help to relieve withdrawal symptoms. Your health care provider may recommend: ? Nicotine patches, gum, or lozenges. ? Nicotine inhalers or sprays. ? Non-nicotine medicine that is taken by mouth.  Talk with your health care provider about combining strategies, such as taking medicines while you are also receiving in-person counseling. Using these two strategies together   makes you more likely to succeed in quitting than if you used either strategy on its own. If you are pregnant or breastfeeding, talk with your health care provider about finding counseling or other support strategies to quit smoking. Do not take medicine to help you quit smoking unless told to do so by your health care  provider. What things can I do to make it easier to quit? Quitting smoking might feel overwhelming at first, but there is a lot that you can do to make it easier. Take these important actions:  Reach out to your family and friends and ask that they support and encourage you during this time. Call telephone quitlines, reach out to support groups, or work with a counselor for support.  Ask people who smoke to avoid smoking around you.  Avoid places that trigger you to smoke, such as bars, parties, or smoke-break areas at work.  Spend time around people who do not smoke.  Lessen stress in your life, because stress can be a smoking trigger for some people. To lessen stress, try: ? Exercising regularly. ? Deep-breathing exercises. ? Yoga. ? Meditating. ? Performing a body scan. This involves closing your eyes, scanning your body from head to toe, and noticing which parts of your body are particularly tense. Purposefully relax the muscles in those areas.  Download or purchase mobile phone or tablet apps (applications) that can help you stick to your quit plan by providing reminders, tips, and encouragement. There are many free apps, such as QuitGuide from the CDC (Centers for Disease Control and Prevention). You can find other support for quitting smoking (smoking cessation) through smokefree.gov and other websites.  How will I feel when I quit smoking? Within the first 24 hours of quitting smoking, you may start to feel some withdrawal symptoms. These symptoms are usually most noticeable 2-3 days after quitting, but they usually do not last beyond 2-3 weeks. Changes or symptoms that you might experience include:  Mood swings.  Restlessness, anxiety, or irritation.  Difficulty concentrating.  Dizziness.  Strong cravings for sugary foods in addition to nicotine.  Mild weight gain.  Constipation.  Nausea.  Coughing or a sore throat.  Changes in how your medicines work in your  body.  A depressed mood.  Difficulty sleeping (insomnia).  After the first 2-3 weeks of quitting, you may start to notice more positive results, such as:  Improved sense of smell and taste.  Decreased coughing and sore throat.  Slower heart rate.  Lower blood pressure.  Clearer skin.  The ability to breathe more easily.  Fewer sick days.  Quitting smoking is very challenging for most people. Do not get discouraged if you are not successful the first time. Some people need to make many attempts to quit before they achieve long-term success. Do your best to stick to your quit plan, and talk with your health care provider if you have any questions or concerns. This information is not intended to replace advice given to you by your health care provider. Make sure you discuss any questions you have with your health care provider. Document Released: 05/14/2001 Document Revised: 01/16/2016 Document Reviewed: 10/04/2014 Elsevier Interactive Patient Education  2017 [employer]  

## 2016-07-04 NOTE — Progress Notes (Signed)
   HPI  CC: Smoking cessation Patient is here to follow-up on her decision to quit smoking. She states that her quit date was 06/12/16. Since that time she has been completely abstinent from tobacco use. She is been using Chantix and nicotine lozenges with good control of her cravings. She endorses some side effects with the Chantix. Specifically she has had some "crazy dreams". These dreams have since resolved but she states they were present for the first 2 weeks of Chantix use. Patient is very pleased with her current results.  ROS: Patient denies any recent fevers, chills, headache, blurred vision, dyspnea, dysphagia, chest pain, nausea, vomiting, diarrhea, weakness, numbness, or paresthesias.  CC, SH/smoking status, and VS noted  Objective: BP (!) 142/80   Pulse 70   Temp 98.2 F (36.8 C) (Oral)   Wt 157 lb (71.2 kg)   SpO2 99%   BMI 28.72 kg/m  Gen: NAD, alert, cooperative, and pleasant. CV: RRR, no murmur Resp: CTAB, no wheezes, non-labored Ext: No edema, warm Neuro: Alert and oriented, Speech clear, No gross deficits  Assessment and plan:  Encounter for smoking cessation counseling Quit date: 06/12/16 Medications: Chantix, nicotine lozenges Patient feels as though her decision to quit smoking was a good choice. She has been doing well and has been smoking free since 06/12/16. Patient like to continue the Chantix for another 2 months. Patient is also pleased with the throat lozenges and would like to continue these as well. - Refilled Chantix, and nicotine lozenges - Follow-up in 2 months   Meds ordered this encounter  Medications  . varenicline (CHANTIX CONTINUING MONTH PAK) 1 MG tablet    Sig: Take 1 tablet (1 mg total) by mouth 2 (two) times daily.    Dispense:  60 tablet    Refill:  1     Kathee DeltonIan D Maleeka Sabatino, MD,MS,  PGY3 07/04/2016 1:59 PM

## 2016-07-08 ENCOUNTER — Ambulatory Visit (INDEPENDENT_AMBULATORY_CARE_PROVIDER_SITE_OTHER): Payer: No Typology Code available for payment source | Admitting: *Deleted

## 2016-07-08 DIAGNOSIS — Z111 Encounter for screening for respiratory tuberculosis: Secondary | ICD-10-CM | POA: Diagnosis not present

## 2016-07-08 NOTE — Progress Notes (Signed)
   PPD placed Left Forearm.  Pt to return 07/10/16 for reading.  Pt tolerated intradermal injection. Clovis PuMartin, Tamika L, RN

## 2016-07-10 ENCOUNTER — Ambulatory Visit (INDEPENDENT_AMBULATORY_CARE_PROVIDER_SITE_OTHER): Payer: No Typology Code available for payment source | Admitting: *Deleted

## 2016-07-10 ENCOUNTER — Encounter: Payer: Self-pay | Admitting: *Deleted

## 2016-07-10 DIAGNOSIS — Z111 Encounter for screening for respiratory tuberculosis: Secondary | ICD-10-CM

## 2016-07-10 LAB — TB SKIN TEST
Induration: 0 mm
TB Skin Test: NEGATIVE

## 2016-07-10 NOTE — Progress Notes (Signed)
   PPD Reading Note PPD read and results entered in Epic[facility]. Result: 0 mm induration. Interpretation: Negative If test not read within 48-72 hours of initial placement, patient advised to repeat in other arm 1-3 weeks after this test. Allergic reaction: no  Breanna Mcdaniel L, RN  

## 2016-09-06 ENCOUNTER — Telehealth: Payer: Self-pay | Admitting: Family Medicine

## 2016-09-06 NOTE — Telephone Encounter (Signed)
Scheduled appointment. Patient was advised to come in for follow-up following last office visit.

## 2016-09-16 ENCOUNTER — Telehealth: Payer: Self-pay | Admitting: Family Medicine

## 2016-09-16 NOTE — Telephone Encounter (Signed)
No answer, LMOVM. Called to confirm appointment for 09/17/16. Patient was advised to arrive early for check-in and to bring any current medications. °

## 2016-09-17 ENCOUNTER — Ambulatory Visit: Payer: No Typology Code available for payment source | Admitting: Family Medicine

## 2016-11-03 ENCOUNTER — Other Ambulatory Visit: Payer: Self-pay | Admitting: Family Medicine

## 2016-11-29 ENCOUNTER — Other Ambulatory Visit: Payer: Self-pay | Admitting: Family Medicine

## 2016-11-29 DIAGNOSIS — L309 Dermatitis, unspecified: Secondary | ICD-10-CM

## 2017-06-29 ENCOUNTER — Ambulatory Visit (HOSPITAL_COMMUNITY)
Admission: EM | Admit: 2017-06-29 | Discharge: 2017-06-29 | Disposition: A | Discharge status: Home / Self Care | Payer: No Typology Code available for payment source | Attending: Family Medicine | Admitting: Family Medicine

## 2017-06-29 ENCOUNTER — Other Ambulatory Visit: Payer: Self-pay

## 2017-06-29 ENCOUNTER — Encounter (HOSPITAL_COMMUNITY): Payer: Self-pay | Admitting: *Deleted

## 2017-06-29 DIAGNOSIS — Z8639 Personal history of other endocrine, nutritional and metabolic disease: Secondary | ICD-10-CM | POA: Diagnosis not present

## 2017-06-29 DIAGNOSIS — Z8249 Family history of ischemic heart disease and other diseases of the circulatory system: Secondary | ICD-10-CM | POA: Insufficient documentation

## 2017-06-29 DIAGNOSIS — I10 Essential (primary) hypertension: Secondary | ICD-10-CM | POA: Insufficient documentation

## 2017-06-29 DIAGNOSIS — R05 Cough: Secondary | ICD-10-CM | POA: Insufficient documentation

## 2017-06-29 DIAGNOSIS — R059 Cough, unspecified: Secondary | ICD-10-CM

## 2017-06-29 DIAGNOSIS — Z87891 Personal history of nicotine dependence: Secondary | ICD-10-CM | POA: Insufficient documentation

## 2017-06-29 DIAGNOSIS — Z888 Allergy status to other drugs, medicaments and biological substances status: Secondary | ICD-10-CM | POA: Diagnosis not present

## 2017-06-29 DIAGNOSIS — R52 Pain, unspecified: Secondary | ICD-10-CM | POA: Diagnosis present

## 2017-06-29 LAB — POCT I-STAT, CHEM 8
BUN: 13 mg/dL (ref 6–20)
CALCIUM ION: 1.14 mmol/L — AB (ref 1.15–1.40)
Chloride: 102 mmol/L (ref 101–111)
Creatinine, Ser: 0.7 mg/dL (ref 0.44–1.00)
GLUCOSE: 96 mg/dL (ref 65–99)
HCT: 40 % (ref 36.0–46.0)
Hemoglobin: 13.6 g/dL (ref 12.0–15.0)
Potassium: 3.7 mmol/L (ref 3.5–5.1)
SODIUM: 140 mmol/L (ref 135–145)
TCO2: 27 mmol/L (ref 22–32)

## 2017-06-29 LAB — TSH: TSH: 0.706 u[IU]/mL (ref 0.350–4.500)

## 2017-06-29 LAB — T4, FREE: Free T4: 0.77 ng/dL (ref 0.61–1.12)

## 2017-06-29 MED ORDER — BENZONATATE 100 MG PO CAPS: 100.0000 mg | ORAL_CAPSULE | Freq: Three times a day (TID) | ORAL | 0 refills | Status: DC | PRN

## 2017-06-29 MED ORDER — LISINOPRIL-HYDROCHLOROTHIAZIDE 20-25 MG PO TABS: 1.0000 | ORAL_TABLET | Freq: Every day | ORAL | 11 refills | Status: DC
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
## 2017-06-29 NOTE — ED Triage Notes (Addendum)
Body ache, body aches, started last night, throat is swollen and don't know if her thyroid is acting up again, per pt mucus is yellow, per pt she is drinking a lot of ginger ale.

## 2017-06-29 NOTE — Discharge Instructions (Signed)
You definitely need a primary care provider to follow your blood pressure and thyroid.  I am hoping that your thyroid test will be back later this evening.  In any event it should be back by tomorrow morning you can check it.  It is abnormal we should be contacting you as soon as we get the result in getting you on medicine that would help you.

## 2017-07-01 ENCOUNTER — Ambulatory Visit: Payer: No Typology Code available for payment source | Admitting: Family Medicine

## 2017-07-01 ENCOUNTER — Encounter: Payer: Self-pay | Admitting: Family Medicine

## 2017-07-01 ENCOUNTER — Other Ambulatory Visit: Payer: Self-pay

## 2017-07-01 VITALS — BP 160/100 | HR 70 | Temp 98.0°F | Ht 64.0 in | Wt 164.0 lb

## 2017-07-01 DIAGNOSIS — I10 Essential (primary) hypertension: Secondary | ICD-10-CM | POA: Diagnosis not present

## 2017-07-01 DIAGNOSIS — Z1159 Encounter for screening for other viral diseases: Secondary | ICD-10-CM | POA: Diagnosis not present

## 2017-07-01 DIAGNOSIS — Z1231 Encounter for screening mammogram for malignant neoplasm of breast: Secondary | ICD-10-CM | POA: Diagnosis not present

## 2017-07-01 DIAGNOSIS — F430 Acute stress reaction: Secondary | ICD-10-CM | POA: Diagnosis not present

## 2017-07-01 DIAGNOSIS — L309 Dermatitis, unspecified: Secondary | ICD-10-CM

## 2017-07-01 DIAGNOSIS — Z1239 Encounter for other screening for malignant neoplasm of breast: Secondary | ICD-10-CM

## 2017-07-01 MED ORDER — MOMETASONE FUROATE 0.1 % EX OINT: TOPICAL_OINTMENT | CUTANEOUS | 1 refills | Status: DC

## 2017-07-01 MED ORDER — AMLODIPINE BESYLATE 5 MG PO TABS: 5.0000 mg | ORAL_TABLET | Freq: Every day | ORAL | 3 refills | Status: DC

## 2017-07-01 NOTE — Patient Instructions (Addendum)
It was a pleasure to see you today! Thank you for choosing [facility] for your primary care. Candice MagesErica R Bordenave was seen for blood pressure.   Our plans for today were:  Add the new blood pressure medicine.   Make an appt to be seen in 1 week to recheck your blood pressure.   Call if you have any concerns between now and then.  The counselor will call you.    You should return to our clinic to see Dr. Chanetta Marshallimberlake in 1 week for blood pressure recheck.   Best,  Dr. Chanetta Marshallimberlake

## 2017-07-01 NOTE — Progress Notes (Signed)
   CC: wants thyroid results  HPI Patient presents 2 days after vigil visiting urgent care due to body aches and feeling unwell.  She has a history of hyperthyroidism which was treated with PTU, and she has been off of this for several years.  She thought that her thyroid was acting up which is why she went to urgent care.  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  Her blood pressure was elevated at urgent care, and they prescribed lisinopril HCTZ and requested that she see her PCP.  She is also curious about her thyroid lab results.  Additionally, she reports that she no longer smokes marijuana, she has also been off cigarettes for 1 year but has been using nicotine tablets over-the-counter.  She also discontinued her blood pressure medicines on her own when she stopped smoking, admits this may not have been a good idea.   ROS: Denies chest pain, weakness, shortness of breath, abdominal pain, dysuria.  CC, SH/smoking status, and VS noted  Objective: BP (!) 160/100 (BP Location: Right Arm, Patient Position: Sitting, Cuff Size: Normal) Comment: told to come back in for a recheck post med per Haskel Dewalt  Pulse 70   Temp 98 F (36.7 C) (Oral)   Ht 5\' 4"  (1.626 m)   Wt 164 lb (74.4 kg)   LMP  (LMP Unknown)   SpO2 98%   BMI 28.15 kg/m  Gen: NAD, alert, cooperative, and pleasant. HEENT: NCAT, EOMI, PERRL CV: RRR, no murmur Resp: CTAB, no wheezes, non-labored Ext: No edema, warm Neuro: Alert and oriented, Speech clear, No gross deficits  Assessment and plan:  HYPERTENSION, BENIGN SYSTEMIC Patient given lisinopril HCTZ at urgent care, will continue this.  Check BMP today for creatinine and electrolytes given new start of hypertension med.  Additionally, add Norvasc 5 mg which was a prior med for her.  Return to clinic in 1 week to recheck blood pressure.  Stress reaction Patient has a very busy and stressful job,  reports that she feels this is impacting her blood pressure as well as her general health.  No [facility] available today, will send them a message and ask them to call her.   Orders Placed This Encounter  Procedures  . MM Digital Screening    Standing Status:   Future    Standing Expiration Date:   08/30/2018    Order Specific Question:   Reason for Exam (SYMPTOM  OR DIAGNOSIS REQUIRED)    Answer:   screening for breast cancer    Order Specific Question:   Is the patient pregnant?    Answer:   No    Order Specific Question:   Preferred imaging location?    Answer[facility]  . Basic metabolic panel  . Hepatitis C antibody    Meds ordered this encounter  Medications  . amLODipine (NORVASC) 5 MG tablet    Sig: Take 1 tablet (5 mg total) by mouth daily.    Dispense:  90 tablet    Refill:  3  . mometasone (ELOCON) 0.1 % ointment    Sig: APPLY  OINTMENT TOPICALLY TO AFFECTED AREA ONCE DAILY    Dispense:  45 g    Refill:  1    Please consider 90 day supplies to promote better adherence    Loni MuseKate Melah Ebling, MD, PGY2 07/04/2017 12:06 PM

## 2017-07-02 LAB — BASIC METABOLIC PANEL
BUN/Creatinine Ratio: 16 (ref 9–23)
BUN: 14 mg/dL (ref 6–24)
CALCIUM: 9.3 mg/dL (ref 8.7–10.2)
CHLORIDE: 100 mmol/L (ref 96–106)
CO2: 26 mmol/L (ref 20–29)
Creatinine, Ser: 0.89 mg/dL (ref 0.57–1.00)
GFR, EST AFRICAN AMERICAN: 86 mL/min/{1.73_m2} (ref >59)
GFR, EST NON AFRICAN AMERICAN: 74 mL/min/{1.73_m2} (ref >59)
Glucose: 107 mg/dL — ABNORMAL HIGH (ref 65–99)
Potassium: 3.6 mmol/L (ref 3.5–5.2)
Sodium: 143 mmol/L (ref 134–144)

## 2017-07-02 LAB — HEPATITIS C ANTIBODY: Hep C Virus Ab: 0.1 s/co ratio (ref 0.0–0.9)

## 2017-07-04 ENCOUNTER — Telehealth: Payer: Self-pay | Admitting: Psychology

## 2017-07-04 ENCOUNTER — Emergency Department (HOSPITAL_COMMUNITY): Payer: No Typology Code available for payment source

## 2017-07-04 ENCOUNTER — Emergency Department (HOSPITAL_COMMUNITY)
Admission: EM | Admit: 2017-07-04 | Discharge: 2017-07-04 | Disposition: A | Discharge status: Home / Self Care | Payer: No Typology Code available for payment source | Attending: Emergency Medicine | Admitting: Emergency Medicine

## 2017-07-04 ENCOUNTER — Encounter (HOSPITAL_COMMUNITY): Payer: Self-pay

## 2017-07-04 ENCOUNTER — Other Ambulatory Visit: Payer: Self-pay

## 2017-07-04 DIAGNOSIS — S0083XA Contusion of other part of head, initial encounter: Secondary | ICD-10-CM | POA: Diagnosis not present

## 2017-07-04 DIAGNOSIS — Y939 Activity, unspecified: Secondary | ICD-10-CM | POA: Insufficient documentation

## 2017-07-04 DIAGNOSIS — Z87891 Personal history of nicotine dependence: Secondary | ICD-10-CM | POA: Insufficient documentation

## 2017-07-04 DIAGNOSIS — Z041 Encounter for examination and observation following transport accident: Secondary | ICD-10-CM | POA: Diagnosis present

## 2017-07-04 DIAGNOSIS — Y999 Unspecified external cause status: Secondary | ICD-10-CM | POA: Diagnosis not present

## 2017-07-04 DIAGNOSIS — Y929 Unspecified place or not applicable: Secondary | ICD-10-CM | POA: Insufficient documentation

## 2017-07-04 DIAGNOSIS — I10 Essential (primary) hypertension: Secondary | ICD-10-CM | POA: Diagnosis not present

## 2017-07-04 DIAGNOSIS — M25512 Pain in left shoulder: Secondary | ICD-10-CM | POA: Diagnosis not present

## 2017-07-04 DIAGNOSIS — R51 Headache: Secondary | ICD-10-CM | POA: Insufficient documentation

## 2017-07-04 DIAGNOSIS — F430 Acute stress reaction: Secondary | ICD-10-CM | POA: Insufficient documentation

## 2017-07-04 MED ORDER — CYCLOBENZAPRINE HCL 10 MG PO TABS: 10.0000 mg | ORAL_TABLET | Freq: Two times a day (BID) | ORAL | 0 refills | Status: DC | PRN

## 2017-07-04 MED ORDER — NAPROXEN 500 MG PO TABS: 500.0000 mg | ORAL_TABLET | Freq: Two times a day (BID) | ORAL | 0 refills | Status: DC

## 2017-07-04 NOTE — Assessment & Plan Note (Signed)
Patient has a very busy and stressful job, reports that she feels this is impacting her blood pressure as well as her general health.  No [facility] available today, will send them a message and ask them to call her.

## 2017-07-04 NOTE — ED Triage Notes (Signed)
Patient also c/o left shoulder pain and left-sided headache. patient also c/o slight dizziness.

## 2017-07-04 NOTE — Assessment & Plan Note (Signed)
Patient given lisinopril HCTZ at urgent care, will continue this.  Check BMP today for creatinine and electrolytes given new start of hypertension med.  Additionally, add Norvasc 5 mg which was a prior med for her.  Return to clinic in 1 week to recheck blood pressure.

## 2017-07-04 NOTE — Discharge Instructions (Signed)
Do not drive while taking the muscle relaxer as it will make you sleepy. Follow up with  your doctor or return here for worsening symptoms.  °
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
## 2017-07-04 NOTE — ED Notes (Addendum)
Wheeled out in wheelchair with family, able to ambulate independently from wheelchair to car.
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
## 2017-07-07 ENCOUNTER — Encounter: Payer: Self-pay | Admitting: Psychology

## 2017-07-07 ENCOUNTER — Telehealth: Payer: Self-pay | Admitting: Family Medicine
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
## 2017-07-10 NOTE — Telephone Encounter (Signed)
Spoke to pt. She has gotten it taken care of. Sunday SpillersSharon T Saunders, CMA

## 2017-07-11 ENCOUNTER — Ambulatory Visit: Payer: No Typology Code available for payment source

## 2017-07-11 ENCOUNTER — Ambulatory Visit: Payer: No Typology Code available for payment source | Admitting: Family Medicine

## 2018-08-20 ENCOUNTER — Telehealth: Payer: Self-pay | Admitting: Family Medicine

## 2018-08-20 DIAGNOSIS — L309 Dermatitis, unspecified: Secondary | ICD-10-CM

## 2018-08-20 MED ORDER — AMLODIPINE BESYLATE 5 MG PO TABS: 5.0000 mg | ORAL_TABLET | Freq: Every day | ORAL | 3 refills | Status: DC

## 2018-08-20 MED ORDER — MOMETASONE FUROATE 0.1 % EX OINT: TOPICAL_OINTMENT | CUTANEOUS | 1 refills | Status: DC

## 2018-08-20 NOTE — Telephone Encounter (Signed)
Pt wants to discuss medications with Timberlake. She also needs some refills on some meds. Pt doesn't want to come in for an appt due to COVID-19. Told pt Chanetta Marshall will call her back to discuss these meds over the phone. Pt callback is [phone].

## 2018-08-20 NOTE — Telephone Encounter (Signed)
Called patient due to wanting an appt for med refills. 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 Wants refills on HTN med, topical steroid. Just taking norvasc for BP - last time she checked it was 140/79. She is stressed at work with COVID concerns. We will send norvasc and mometasone refill and asked her to call for recheck in 1 month.

## 2019-02-09 ENCOUNTER — Other Ambulatory Visit: Payer: Self-pay

## 2019-02-09 DIAGNOSIS — L309 Dermatitis, unspecified: Secondary | ICD-10-CM

## 2019-02-10 MED ORDER — MOMETASONE FUROATE 0.1 % EX OINT: TOPICAL_OINTMENT | CUTANEOUS | 1 refills | Status: DC

## 2019-11-21 ENCOUNTER — Emergency Department (HOSPITAL_COMMUNITY): Payer: No Typology Code available for payment source

## 2019-11-21 ENCOUNTER — Other Ambulatory Visit: Payer: Self-pay

## 2019-11-21 ENCOUNTER — Inpatient Hospital Stay (HOSPITAL_COMMUNITY)
Admission: EM | Admit: 2019-11-21 | Discharge: 2019-11-23 | DRG: 305 | Disposition: A | Discharge status: Home / Self Care | Payer: Self-pay | Attending: Family Medicine | Admitting: Family Medicine

## 2019-11-21 DIAGNOSIS — R519 Headache, unspecified: Secondary | ICD-10-CM

## 2019-11-21 DIAGNOSIS — Z87891 Personal history of nicotine dependence: Secondary | ICD-10-CM

## 2019-11-21 DIAGNOSIS — E876 Hypokalemia: Secondary | ICD-10-CM | POA: Diagnosis present

## 2019-11-21 DIAGNOSIS — I998 Other disorder of circulatory system: Secondary | ICD-10-CM | POA: Diagnosis present

## 2019-11-21 DIAGNOSIS — Z7982 Long term (current) use of aspirin: Secondary | ICD-10-CM

## 2019-11-21 DIAGNOSIS — Z8249 Family history of ischemic heart disease and other diseases of the circulatory system: Secondary | ICD-10-CM

## 2019-11-21 DIAGNOSIS — F1290 Cannabis use, unspecified, uncomplicated: Secondary | ICD-10-CM | POA: Diagnosis present

## 2019-11-21 DIAGNOSIS — L309 Dermatitis, unspecified: Secondary | ICD-10-CM | POA: Diagnosis present

## 2019-11-21 DIAGNOSIS — I2510 Atherosclerotic heart disease of native coronary artery without angina pectoris: Secondary | ICD-10-CM | POA: Diagnosis present

## 2019-11-21 DIAGNOSIS — I10 Essential (primary) hypertension: Secondary | ICD-10-CM | POA: Diagnosis present

## 2019-11-21 DIAGNOSIS — I160 Hypertensive urgency: Principal | ICD-10-CM | POA: Diagnosis present

## 2019-11-21 DIAGNOSIS — I671 Cerebral aneurysm, nonruptured: Secondary | ICD-10-CM | POA: Diagnosis present

## 2019-11-21 DIAGNOSIS — Z20822 Contact with and (suspected) exposure to covid-19: Secondary | ICD-10-CM | POA: Diagnosis present

## 2019-11-21 DIAGNOSIS — Z79899 Other long term (current) drug therapy: Secondary | ICD-10-CM

## 2019-11-21 LAB — BASIC METABOLIC PANEL
Anion gap: 11 (ref 5–15)
BUN: 9 mg/dL (ref 6–20)
CO2: 24 mmol/L (ref 22–32)
Calcium: 9.2 mg/dL (ref 8.9–10.3)
Chloride: 102 mmol/L (ref 98–111)
Creatinine, Ser: 0.69 mg/dL (ref 0.44–1.00)
GFR calc Af Amer: >60 mL/min (ref >60)
GFR calc non Af Amer: >60 mL/min (ref >60)
Glucose, Bld: 109 mg/dL — ABNORMAL HIGH (ref 70–99)
Potassium: 3.2 mmol/L — ABNORMAL LOW (ref 3.5–5.1)
Sodium: 137 mmol/L (ref 135–145)

## 2019-11-21 LAB — CBC
HCT: 43.3 % (ref 36.0–46.0)
Hemoglobin: 14 g/dL (ref 12.0–15.0)
MCH: 29.5 pg (ref 26.0–34.0)
MCHC: 32.3 g/dL (ref 30.0–36.0)
MCV: 91.2 fL (ref 80.0–100.0)
Platelets: 299 10*3/uL (ref 150–400)
RBC: 4.75 MIL/uL (ref 3.87–5.11)
RDW: 13.7 % (ref 11.5–15.5)
WBC: 7.1 10*3/uL (ref 4.0–10.5)
nRBC: 0 % (ref 0.0–0.2)

## 2019-11-21 LAB — TROPONIN I (HIGH SENSITIVITY): Troponin I (High Sensitivity): 9 ng/L (ref <18)

## 2019-11-21 MED ORDER — SODIUM CHLORIDE 0.9% FLUSH: 3.0000 mL | Freq: Once | INTRAVENOUS | Status: DC

## 2019-11-21 NOTE — ED Triage Notes (Signed)
CT Head per Dr Charm Barges

## 2019-11-21 NOTE — ED Triage Notes (Signed)
Patient reports she has been feeling "unwell" today. HA, emesis. Checked BP at home and noted to be hypertensive. Denies CP/SOB. Denies numbness/tingling, weakness. Ambulatory.

## 2019-11-21 NOTE — ED Notes (Signed)
Did not collect dark green tube, needs to be collected in back.

## 2019-11-22 ENCOUNTER — Encounter (HOSPITAL_COMMUNITY): Payer: Self-pay | Admitting: Family Medicine

## 2019-11-22 ENCOUNTER — Inpatient Hospital Stay (HOSPITAL_COMMUNITY): Payer: No Typology Code available for payment source

## 2019-11-22 ENCOUNTER — Other Ambulatory Visit: Payer: Self-pay

## 2019-11-22 ENCOUNTER — Emergency Department (HOSPITAL_COMMUNITY): Payer: No Typology Code available for payment source

## 2019-11-22 DIAGNOSIS — I998 Other disorder of circulatory system: Secondary | ICD-10-CM | POA: Diagnosis present

## 2019-11-22 DIAGNOSIS — I160 Hypertensive urgency: Secondary | ICD-10-CM | POA: Diagnosis present

## 2019-11-22 DIAGNOSIS — I472 Ventricular tachycardia: Secondary | ICD-10-CM

## 2019-11-22 LAB — BASIC METABOLIC PANEL
Anion gap: 12 (ref 5–15)
Anion gap: 8 (ref 5–15)
BUN: 9 mg/dL (ref 6–20)
BUN: 16 mg/dL (ref 6–20)
CO2: 23 mmol/L (ref 22–32)
CO2: 27 mmol/L (ref 22–32)
Calcium: 9.3 mg/dL (ref 8.9–10.3)
Calcium: 9.1 mg/dL (ref 8.9–10.3)
Chloride: 102 mmol/L (ref 98–111)
Chloride: 103 mmol/L (ref 98–111)
Creatinine, Ser: 0.58 mg/dL (ref 0.44–1.00)
Creatinine, Ser: 1.1 mg/dL — ABNORMAL HIGH (ref 0.44–1.00)
GFR calc Af Amer: >60 mL/min (ref >60)
GFR calc Af Amer: >60 mL/min (ref >60)
GFR calc non Af Amer: >60 mL/min (ref >60)
GFR calc non Af Amer: 56 mL/min — ABNORMAL LOW (ref >60)
Glucose, Bld: 95 mg/dL (ref 70–99)
Glucose, Bld: 93 mg/dL (ref 70–99)
Potassium: 2.9 mmol/L — ABNORMAL LOW (ref 3.5–5.1)
Potassium: 4.4 mmol/L (ref 3.5–5.1)
Sodium: 137 mmol/L (ref 135–145)
Sodium: 138 mmol/L (ref 135–145)

## 2019-11-22 LAB — CBC
HCT: 41.8 % (ref 36.0–46.0)
Hemoglobin: 14 g/dL (ref 12.0–15.0)
MCH: 30 pg (ref 26.0–34.0)
MCHC: 33.5 g/dL (ref 30.0–36.0)
MCV: 89.7 fL (ref 80.0–100.0)
Platelets: 283 10*3/uL (ref 150–400)
RBC: 4.66 MIL/uL (ref 3.87–5.11)
RDW: 13.9 % (ref 11.5–15.5)
WBC: 9.3 10*3/uL (ref 4.0–10.5)
nRBC: 0 % (ref 0.0–0.2)

## 2019-11-22 LAB — HEPATIC FUNCTION PANEL
ALT: 18 U/L (ref 0–44)
AST: 16 U/L (ref 15–41)
Albumin: 3.7 g/dL (ref 3.5–5.0)
Alkaline Phosphatase: 92 U/L (ref 38–126)
Bilirubin, Direct: <0.1 mg/dL (ref 0.0–0.2)
Total Bilirubin: 0.7 mg/dL (ref 0.3–1.2)
Total Protein: 7.8 g/dL (ref 6.5–8.1)

## 2019-11-22 LAB — TSH: TSH: 3.954 u[IU]/mL (ref 0.350–4.500)

## 2019-11-22 LAB — URINALYSIS, ROUTINE W REFLEX MICROSCOPIC
Bilirubin Urine: NEGATIVE
Glucose, UA: NEGATIVE mg/dL
Ketones, ur: NEGATIVE mg/dL
Leukocytes,Ua: NEGATIVE
Nitrite: NEGATIVE
Protein, ur: NEGATIVE mg/dL
Specific Gravity, Urine: 1.028 (ref 1.005–1.030)
pH: 6 (ref 5.0–8.0)

## 2019-11-22 LAB — ECHOCARDIOGRAM COMPLETE
Height: 62 in
Weight: 2624.36 oz

## 2019-11-22 LAB — TROPONIN I (HIGH SENSITIVITY)
Troponin I (High Sensitivity): 12 ng/L (ref <18)
Troponin I (High Sensitivity): 16 ng/L (ref <18)

## 2019-11-22 LAB — RAPID URINE DRUG SCREEN, HOSP PERFORMED
Amphetamines: NOT DETECTED
Barbiturates: NOT DETECTED
Benzodiazepines: NOT DETECTED
Cocaine: NOT DETECTED
Opiates: NOT DETECTED
Tetrahydrocannabinol: POSITIVE — AB

## 2019-11-22 LAB — SARS CORONAVIRUS 2 BY RT PCR (HOSPITAL ORDER, PERFORMED IN ~~LOC~~ HOSPITAL LAB): SARS Coronavirus 2: NEGATIVE

## 2019-11-22 LAB — I-STAT BETA HCG BLOOD, ED (MC, WL, AP ONLY): I-stat hCG, quantitative: <5 m[IU]/mL (ref <5)

## 2019-11-22 LAB — LIPID PANEL
Cholesterol: 182 mg/dL (ref 0–200)
HDL: 65 mg/dL (ref >40)
LDL Cholesterol: 109 mg/dL — ABNORMAL HIGH (ref 0–99)
Total CHOL/HDL Ratio: 2.8 RATIO
Triglycerides: 42 mg/dL (ref <150)
VLDL: 8 mg/dL (ref 0–40)

## 2019-11-22 LAB — HEMOGLOBIN A1C
Hgb A1c MFr Bld: 5.7 % — ABNORMAL HIGH (ref 4.8–5.6)
Mean Plasma Glucose: 117 mg/dL

## 2019-11-22 LAB — MAGNESIUM: Magnesium: 2.1 mg/dL (ref 1.7–2.4)

## 2019-11-22 LAB — HIV ANTIBODY (ROUTINE TESTING W REFLEX): HIV Screen 4th Generation wRfx: NONREACTIVE

## 2019-11-22 MED ORDER — AMLODIPINE BESYLATE 10 MG PO TABS
10.0000 mg | ORAL_TABLET | Freq: Every day | ORAL | Status: DC
  Administered 2019-11-22 – 2019-11-23 (×2): 10 mg via ORAL
  Filled 2019-11-22: qty 2
  Filled 2019-11-22: qty 1

## 2019-11-22 MED ORDER — NICOTINE 7 MG/24HR TD PT24
7.0000 mg | MEDICATED_PATCH | Freq: Every day | TRANSDERMAL | Status: DC
  Filled 2019-11-22: qty 1

## 2019-11-22 MED ORDER — HYDRALAZINE HCL 20 MG/ML IJ SOLN
10.0000 mg | Freq: Once | INTRAMUSCULAR | Status: AC
  Administered 2019-11-22: 10 mg via INTRAVENOUS
  Filled 2019-11-22: qty 1

## 2019-11-22 MED ORDER — AMLODIPINE BESYLATE 5 MG PO TABS
10.0000 mg | ORAL_TABLET | Freq: Once | ORAL | Status: AC
  Administered 2019-11-22: 10 mg via ORAL
  Filled 2019-11-22: qty 2

## 2019-11-22 MED ORDER — ENOXAPARIN SODIUM 40 MG/0.4ML ~~LOC~~ SOLN
40.0000 mg | Freq: Every day | SUBCUTANEOUS | Status: DC
  Administered 2019-11-22 – 2019-11-23 (×2): 40 mg via SUBCUTANEOUS
  Filled 2019-11-22 (×2): qty 0.4

## 2019-11-22 MED ORDER — ACETAMINOPHEN 325 MG PO TABS: 650.0000 mg | ORAL_TABLET | Freq: Four times a day (QID) | ORAL | Status: DC | PRN

## 2019-11-22 MED ORDER — LABETALOL HCL 5 MG/ML IV SOLN
20.0000 mg | Freq: Once | INTRAVENOUS | Status: AC
  Administered 2019-11-22: 20 mg via INTRAVENOUS
  Filled 2019-11-22: qty 4

## 2019-11-22 MED ORDER — DIPHENHYDRAMINE HCL 50 MG/ML IJ SOLN
25.0000 mg | Freq: Once | INTRAMUSCULAR | Status: AC
  Administered 2019-11-22: 25 mg via INTRAVENOUS
  Filled 2019-11-22: qty 1

## 2019-11-22 MED ORDER — METOCLOPRAMIDE HCL 5 MG/ML IJ SOLN
10.0000 mg | Freq: Once | INTRAMUSCULAR | Status: AC
  Administered 2019-11-22: 01:00:00 10 mg via INTRAVENOUS
  Filled 2019-11-22: qty 2

## 2019-11-22 MED ORDER — ACETAMINOPHEN 650 MG RE SUPP: 650.0000 mg | Freq: Four times a day (QID) | RECTAL | Status: DC | PRN

## 2019-11-22 MED ORDER — POTASSIUM CHLORIDE CRYS ER 20 MEQ PO TBCR
40.0000 meq | EXTENDED_RELEASE_TABLET | Freq: Two times a day (BID) | ORAL | Status: AC
  Administered 2019-11-22 (×2): 40 meq via ORAL
  Filled 2019-11-22 (×2): qty 2

## 2019-11-22 MED ORDER — POTASSIUM CHLORIDE CRYS ER 20 MEQ PO TBCR
40.0000 meq | EXTENDED_RELEASE_TABLET | Freq: Once | ORAL | Status: AC
  Administered 2019-11-22: 40 meq via ORAL
  Filled 2019-11-22: qty 2

## 2019-11-22 MED ORDER — HYDROCHLOROTHIAZIDE 25 MG PO TABS
25.0000 mg | ORAL_TABLET | Freq: Once | ORAL | Status: AC
  Administered 2019-11-22: 25 mg via ORAL
  Filled 2019-11-22: qty 1

## 2019-11-22 MED ORDER — IOHEXOL 350 MG/ML SOLN
100.0000 mL | Freq: Once | INTRAVENOUS | Status: AC | PRN
  Administered 2019-11-22: 100 mL via INTRAVENOUS

## 2019-11-22 MED ORDER — ASPIRIN EC 81 MG PO TBEC
81.0000 mg | DELAYED_RELEASE_TABLET | Freq: Every day | ORAL | Status: DC
  Administered 2019-11-22 – 2019-11-23 (×2): 81 mg via ORAL
  Filled 2019-11-22 (×2): qty 1

## 2019-11-22 MED ORDER — LABETALOL HCL 5 MG/ML IV SOLN
20.0000 mg | Freq: Once | INTRAVENOUS | Status: DC
  Filled 2019-11-22: qty 4

## 2019-11-22 MED ORDER — LISINOPRIL 20 MG PO TABS
20.0000 mg | ORAL_TABLET | Freq: Once | ORAL | Status: AC
  Administered 2019-11-22: 20 mg via ORAL
  Filled 2019-11-22: qty 1

## 2019-11-22 NOTE — Progress Notes (Signed)
  Echocardiogram 2D Echocardiogram has been performed.  Janalyn Harder 11/22/2019, 5:27 PM

## 2019-11-22 NOTE — Progress Notes (Signed)
Patient asked to shower. Explained infection risks of showering with peripheral IV present. Patient then expressed desire to shower "around" her IV. Explained that this still has risk and suggested she wash up at sink with washclothes. Patient was agreeable to this suggestion and will request doctor's order to shower in AM.

## 2019-11-22 NOTE — Hospital Course (Signed)
Follow-up -Repeat UA outpatient for microscopic hematuria

## 2019-11-22 NOTE — Progress Notes (Signed)
MD notified of symptomatic blood pressure of 181/80. MD ordered a CT, order not yet present in chart.

## 2019-11-22 NOTE — Progress Notes (Signed)
Curbside Note re MCA Aneurysm  Reached out to Neurosurgery regarding the small 59mm R MCA bifurcation aneurysm found on this patient's CTA head (11/22/2019). This team was unable to find details for follow up regarding such an aneurysm. Asked about information regarding follow up and management.   Per Neurosurgery this patient can follow up with them in the office. Strongly recommended this patient maintain compliance with her blood pressure medications.   [facility] [address] [place], [place],  [address] Phone: [phone] Fax: [phone]  Called and left a message with the new patient referral coordinator, will hopefully hear from her soon to establish care for this patient in the future.  Milus Banister, [place], PGY-2 11/22/2019 12:03 PM     CT Angio Head W or Wo Contrast  Result Date: 11/22/2019 CLINICAL DATA:  Initial evaluation for acute headache. EXAM: CT ANGIOGRAPHY HEAD AND NECK TECHNIQUE: Multidetector CT imaging of the head and neck was performed using the standard protocol during bolus administration of intravenous contrast. Multiplanar CT image reconstructions and MIPs were obtained to evaluate the vascular anatomy. Carotid stenosis measurements (when applicable) are obtained utilizing NASCET criteria, using the distal internal carotid diameter as the denominator. CONTRAST:  182mL OMNIPAQUE IOHEXOL 350 MG/ML SOLN COMPARISON:  Prior head CT from earlier the same day. FINDINGS: CTA NECK FINDINGS Aortic arch: Visualized aortic arch of normal caliber with normal branch pattern. Moderate atheromatous change about the arch and origin of the great vessels without hemodynamically significant stenosis. Right carotid system: Right CCA mildly tortuous but widely patent to the bifurcation without stenosis. Mild eccentric plaque at the origin of the right ICA without hemodynamically significant stenosis. Distal right ICA tortuous  with mild atheromatous change without high-grade stenosis. Left carotid system: Left CCA mildly tortuous but widely patent to the bifurcation without stenosis. Scattered calcified plaque about the left bifurcation without hemodynamically significant stenosis. Left ICA widely patent distally to the skull base without stenosis, dissection or occlusion. Vertebral arteries: Both vertebral arteries arise from the subclavian arteries. Short-segment 30% stenosis at the origin of the right subclavian artery from the right brachiocephalic artery (series 7, image 285). No other proximal subclavian stenosis. Vertebral arteries mildly tortuous proximally but are widely patent to the skull base without stenosis, dissection or occlusion. Skeleton: No acute osseous abnormality. No discrete or worrisome osseous lesions. Other neck: No other acute soft tissue abnormality within the neck. No mass lesion or adenopathy. Upper chest: No abnormality identified within the visualized upper chest. Review of the MIP images confirms the above findings CTA HEAD FINDINGS Anterior circulation: Petrous segments patent bilaterally. Scattered atheromatous plaque within the cavernous/supraclinoid ICAs with no more than mild multifocal stenosis. A1 segments widely patent. Normal anterior communicating artery complex. Anterior cerebral arteries widely patent to their distal aspects without stenosis. No M1 stenosis or occlusion. There is a focal inferior division, consistent with a small aneurysm (series 8, image 91). This is directed laterally. Left MCA bifurcation within normal limits. Distal MCA branches well perfused and symmetric. Posterior circulation: Vertebral arteries widely patent to the vertebrobasilar junction without stenosis. Left vertebral artery dominant. Both picas are patent. Basilar widely patent to its distal aspect without stenosis. Superior cerebral arteries patent bilaterally. Both PCAs well perfused to their distal aspects  without stenosis. Venous sinuses: Grossly patent allowing for timing the contrast bolus. Left transverse and sigmoid sinuses are markedly hypoplastic. Anatomic variants: None significant. Review of the MIP images confirms the above findings IMPRESSION:  1. Negative CTA for emergent large vessel occlusion. No high-grade or correctable stenosis identified. No other acute vascular abnormality. 2. Mild to moderate atheromatous change about the aortic arch, carotid bifurcations, and carotid siphons without hemodynamically significant or correctable stenosis. 3. 2 mm right MCA bifurcation aneurysm as above. Electronically Signed   By: Rise Mu M.D.   On: 11/22/2019 02:54   CT Head Wo Contrast  Result Date: 11/21/2019 CLINICAL DATA:  Generalized malaise. EXAM: CT HEAD WITHOUT CONTRAST TECHNIQUE: Contiguous axial images were obtained from the base of the skull through the vertex without intravenous contrast. COMPARISON:  July 04, 2017 FINDINGS: Brain: No evidence of acute infarction, hemorrhage, hydrocephalus, extra-axial collection or mass lesion/mass effect. Vascular: No hyperdense vessel or unexpected calcification. Skull: Normal. Negative for fracture or focal lesion. Sinuses/Orbits: No acute finding. Other: None. IMPRESSION: No acute intracranial pathology. Electronically Signed   By: Aram Candela M.D.   On: 11/21/2019 21:26   CT Angio Neck W and/or Wo Contrast  Result Date: 11/22/2019 CLINICAL DATA:  Initial evaluation for acute headache. EXAM: CT ANGIOGRAPHY HEAD AND NECK TECHNIQUE: Multidetector CT imaging of the head and neck was performed using the standard protocol during bolus administration of intravenous contrast. Multiplanar CT image reconstructions and MIPs were obtained to evaluate the vascular anatomy. Carotid stenosis measurements (when applicable) are obtained utilizing NASCET criteria, using the distal internal carotid diameter as the denominator. CONTRAST:  OMNIPAQUE  IOHEXOL 350 MG/ML SOLN COMPARISON:  Prior head CT from earlier the same day. FINDINGS: CTA NECK FINDINGS Aortic arch: Visualized aortic arch of normal caliber with normal branch pattern. Moderate atheromatous change about the arch and origin of the great vessels without hemodynamically significant stenosis. Right carotid system: Right CCA mildly tortuous but widely patent to the bifurcation without stenosis. Mild eccentric plaque at the origin of the right ICA without hemodynamically significant stenosis. Distal right ICA tortuous with mild atheromatous change without high-grade stenosis. Left carotid system: Left CCA mildly tortuous but widely patent to the bifurcation without stenosis. Scattered calcified plaque about the left bifurcation without hemodynamically significant stenosis. Left ICA widely patent distally to the skull base without stenosis, dissection or occlusion. Vertebral arteries: Both vertebral arteries arise from the subclavian arteries. Short-segment 30% stenosis at the origin of the right subclavian artery from the right brachiocephalic artery (series 7, image 285). No other proximal subclavian stenosis. Vertebral arteries mildly tortuous proximally but are widely patent to the skull base without stenosis, dissection or occlusion. Skeleton: No acute osseous abnormality. No discrete or worrisome osseous lesions. Other neck: No other acute soft tissue abnormality within the neck. No mass lesion or adenopathy. Upper chest: No abnormality identified within the visualized upper chest. Review of the MIP images confirms the above findings CTA HEAD FINDINGS Anterior circulation: Petrous segments patent bilaterally. Scattered atheromatous plaque within the cavernous/supraclinoid ICAs with no more than mild multifocal stenosis. A1 segments widely patent. Normal anterior communicating artery complex. Anterior cerebral arteries widely patent to their distal aspects without stenosis. No M1 stenosis or  occlusion. There is a focal inferior division, consistent with a small aneurysm (series 8, image 91). This is directed laterally. Left MCA bifurcation within normal limits. Distal MCA branches well perfused and symmetric. Posterior circulation: Vertebral arteries widely patent to the vertebrobasilar junction without stenosis. Left vertebral artery dominant. Both picas are patent. Basilar widely patent to its distal aspect without stenosis. Superior cerebral arteries patent bilaterally. Both PCAs well perfused to their distal aspects without stenosis. Venous sinuses: Grossly patent  allowing for timing the contrast bolus. Left transverse and sigmoid sinuses are markedly hypoplastic. Anatomic variants: None significant. Review of the MIP images confirms the above findings IMPRESSION: 1. Negative CTA for emergent large vessel occlusion. No high-grade or correctable stenosis identified. No other acute vascular abnormality. 2. Mild to moderate atheromatous change about the aortic arch, carotid bifurcations, and carotid siphons without hemodynamically significant or correctable stenosis. 3. 2 mm right MCA bifurcation aneurysm as above. Electronically Signed   By: Rise Mu M.D.   On: 11/22/2019 02:54

## 2019-11-22 NOTE — H&P (Addendum)
[facility] Admission History and Physical Service Pager: [phone]  Patient name: Candice Evans Medical record number: [id number] Date of birth: 1965/06/09 Age: 54 y.o. Gender: female  Primary Care Provider: Benay Pike, MD Consultants: None  Code Status: Full  Preferred Emergency Contact: Mom: Altamese Cabal [phone], Daughter: Griffin Dakin [phone]   Chief Complaint: elevated BP   Assessment and Plan: Candice Evans is a 54 y.o. female presenting with headache and associated nausea and vomiting. PMH is significant for controlled hypertension, hypothyroidism, tobacco use disorder, THC use, history of cocaine use, eczema  Uncontrolled HTN without focal neuro deficits Patient with with severely elevated blood pressure and noncompliance with medication for the past 9 months. Patient tried taking home amlodipine prior to arrival but vomited medication back up.  She stopped taking Lisinopril/HCTZ >1 year ago as she reported that it gave her headaches. On presentation to the ED blood pressure was 234/101.  ED provider treated blood pressure with IV hydralazine and labetalol.  Patient given home lisinopril, amlodipine and HCTZ.  On admission, blood pressure 180s over 90s.  Patient complaining of nausea vomiting and frontal headache.  EKG with findings of left ventricular hypertrophy.  Troponins 9 and 12.  Repeat pending.  Not likely ACS.  CT angio head and neck significant for small MCA aneurysm.  CT head without acute abnormalities.  Neuro exam not consistent with stroke. No subarachnoid hemorrhage seen on imaging and patient reports gradual worsening of headache. Creatinine grossly unremarkable at 0.69.  Will obtain urinalysis to assess for proteinuria and hematuria.  Consider primary aldosteronism as patient hypokalemic at 3.2. Patient with hx of cocaine use; will obtain UDS. Goal to decrease MAP by 25% then stabilize BP over the next 6 hours. Most likely due to medication  non-compliance with a possible contribution from hyperthyroidism (previously treated and controlled).  She has previously been on 2 antihypertensive medications but is not aware of inadequate control while compliant on 2 medications. Lumbar puncture was considered in the ED to assess for xanthochromia from subarachnoid hemorrhage, this was ultimately canceled due to resolving symptoms and unremarkable CT head. - Admit to progressive, attending Dr. Owens Shark - Follow up AM labs: Lipid panel, A1c, BMP, CBC, TSH, UDS, Mg, LFTs  - Monitor BP  - Amlodipine 10 mg  - Consider ECHO and renal U/S - Continue ASA 81 mg  - Follow up Troponin  - f/u UDS - Lovenox for VTE ppx  - Heart healthy diet  - Mg >2, K >4   Hypokalemia On admission potassium 3.2. -Replete as needed with 20mEq K-dur -Follow-up AM BMP  Coronary artery disease Home medications include 81 mg aspirin.  Patient had a left heart cath in 2008 LAD less than 50% stenosed.  No stenting was done at that time.  Of note, dad and brother died of heart disease reportedly before the age of 69.  -Follow-up A1c, lipid panel -Continue home medications   Eczema  Denies active eczema flare and there was no rash on exam.  Home medications include mometasone and triamcinolone as needed.   Hyperthyroidism  Patient diagnosed in 2008.  She previously took PTU but this was discontinued couple years ago.  TSH 0.7 and free T4 0.77 in January 2019. -Follow-up TSH -Reassess for symptoms of hyperthyroidism  Tobacco Use Disorder: Patient quit smoking 3 years ago.  She used nicotine tablets help her quit.  She smoked about a pack a day since she was 54 years old.   THC use  hx of cocaine use  Patient reports using marijuana on 6/19 on the day the nausea began. Denies recent cocaine use.  -encourageTHC cessation   Possible migraine history The headache that lead Ms. Pun to present to the ED was significantly worse that previous headaches but She notes  that she has occasionally noticed bright geometric patterns the obscure her vision and grow larger over the course of minutes.  Usually, her headaches are isolate to one side of her head above her eye.  These are typically associated with her headaches.  She has not had this particular symptom in the past week and this is not what brought her in today.  FEN/GI: Heart healthy diet, plate electrolytes as needed Prophylaxis: Lovenox  Disposition: Admit to progressive  History of Present Illness:  Candice Evans is a 54 y.o. female presenting with headache and elevated blood pressure at home.  Patient presented to the ED for ongoing headache with associated nausea and vomiting.  Patient reports frontal headache that feels like "pressure".  Nausea began 6/19 before where she was sending her mom's porch when she went home she threw up.  Headache started today.  Rated 10 out of 10 at its worst.  Patient took home amlodipine 5 mg and Tylenol but she vomited it back up.  Reports 2-3 episodes of vomiting.  Of note, patient has not taken amlodipine in the past 9 months.  She states that she stopped taking this medication as she " felt better".  Reports some intermittent shortness of breath but denies chest discomfort/pain.  Denies leg swelling, new vision changes, dizziness, LOC, focal weakness.  Upon arrival in the ED, patient hypertensive 230s/ 100s, head and neck imaging had no acute findings.  Of note, patient does have a small MCA aneurysm.  Patient given IV labetalol and hydralazine with minimal improvement in her blood pressure.  Review Of Systems: Per HPI with the following additions:   Review of Systems  Constitutional: Negative for chills, diaphoresis and fever.  HENT: Negative for congestion and sore throat.   Eyes: Blurred vision: chronic.  Respiratory: Positive for shortness of breath. Negative for cough.   Cardiovascular: Positive for palpitations. Negative for chest pain and leg swelling.   Gastrointestinal: Positive for nausea and vomiting. Negative for abdominal pain.  Genitourinary: Negative for dysuria.  Musculoskeletal: Negative for back pain and neck pain.  Skin: Negative for rash.  Neurological: Positive for dizziness and headaches. Negative for focal weakness and loss of consciousness.    Patient Active Problem List   Diagnosis Date Noted  . Stress reaction 07/04/2017  . Encounter for smoking cessation counseling 07/04/2016  . Family history of cardiovascular disease 12/21/2014  . History of cocaine abuse (HCC) 02/18/2013  . Dental abscess 08/30/2012  . Tobacco abuse counseling 08/26/2012  . Hyperthyroidism 07/31/2006  . HYPERTENSION, BENIGN SYSTEMIC 07/31/2006  . Eczema 07/31/2006    Past Medical History: Past Medical History:  Diagnosis Date  . Eczema   . HTN (hypertension)   . Hyperthyroidism     Past Surgical History: Past Surgical History:  Procedure Laterality Date  . CARDIAC CATHETERIZATION     LAD 50% stenosed, no stent    Social History: Social History   Tobacco Use  . Smoking status: Former Smoker    Packs/day: 0.50    Years: 34.00    Pack years: 17.00    Types: Cigarettes    Start date: 06/04/1979  . Smokeless tobacco: Never Used  . Tobacco comment: wants to quit,  06-29-2017 per pt she stopped Jan 2017  Vaping Use  . Vaping Use: Never used  Substance Use Topics  . Alcohol use: No    Alcohol/week: 0.0 standard drinks  . Drug use: No    Frequency: 0.2 times per week    Types: Marijuana    Comment: former use   Please also refer to relevant sections of EMR.  Family History: Family History  Problem Relation Age of Onset  . Hypertension Father   . Coronary artery disease Father   . Heart failure Father        Died age 56  . Heart failure Brother        Died age 56  . Hypertension Mother     Allergies and Medications: Allergies  Allergen Reactions  . Bupropion Other (See Comments)    Pt said it made her unsympathetic    No current facility-administered medications on file prior to encounter.   Current Outpatient Medications on File Prior to Encounter  Medication Sig Dispense Refill  . acetaminophen (TYLENOL) 325 MG tablet Take 650 mg by mouth 2 (two) times daily as needed for mild pain.    . mometasone (ELOCON) 0.1 % ointment APPLY  OINTMENT TOPICALLY TO AFFECTED AREA ONCE DAILY (Patient taking differently: Apply 1 application topically daily as needed (Affected area). ) 45 g 1  . naproxen sodium (ALEVE) 220 MG tablet Take 220 mg by mouth 2 (two) times daily as needed (Pain).    . triamcinolone cream (KENALOG) 0.1 % APPLY  CREAM EXTERNALLY TO AFFECTED AREA TWICE DAILY (Patient taking differently: Apply 1 application topically 2 (two) times daily as needed (Rash). ) 45 g 0  . amLODipine (NORVASC) 5 MG tablet Take 1 tablet (5 mg total) by mouth daily. (Patient not taking: Reported on 11/22/2019) 90 tablet 3  . aspirin EC 81 MG tablet Take 1 tablet (81 mg total) by mouth daily. (Patient not taking: Reported on 11/22/2019) 30 tablet 0  . benzonatate (TESSALON) 100 MG capsule Take 1-2 capsules (100-200 mg total) by mouth 3 (three) times daily as needed for cough. (Patient not taking: Reported on 11/22/2019) 40 capsule 0  . [employer] NICOTINE POLACRILEX 4 MG lozenge TAKE 1 LOZENGE BY MOUTH AS NEEDED FOR SMOKING CESSATION (Patient not taking: Reported on 11/22/2019) 168 lozenge 1  . cyclobenzaprine (FLEXERIL) 10 MG tablet Take 1 tablet (10 mg total) by mouth 2 (two) times daily as needed for muscle spasms. (Patient not taking: Reported on 11/22/2019) 20 tablet 0  . fluticasone (FLONASE) 50 MCG/ACT nasal spray Place 2 sprays into both nostrils daily. (Patient not taking: Reported on 11/22/2019) 16 g 6  . lisinopril-hydrochlorothiazide (PRINZIDE,ZESTORETIC) 20-25 MG tablet Take 1 tablet by mouth daily. (Patient not taking: Reported on 11/22/2019) 30 tablet 11  . naproxen (NAPROSYN) 500 MG tablet Take 1 tablet (500 mg total) by mouth 2  (two) times daily. (Patient not taking: Reported on 11/22/2019) 20 tablet 0    Objective: BP (!) 218/90   Pulse 74   Temp 98.8 F (37.1 C) (Oral)   Resp 18   Ht 5\' 4"  (1.626 m)   Wt 74.4 kg   LMP  (LMP Unknown)   SpO2 97%   BMI 28.15 kg/m  Exam: GEN:     alert, cooperative and in no acute distress   HENT:  mucus membranes moist, oropharyngeal without lesions or erythema,  nares patent, no nasal discharge, uvula midline  EYES:   pupils equal and reactive, EOM intact, no retinal  hemorrhages appreciated, + red reflex  NECK:  supple, normal ROM, no thyromegaly appreciated  RESP:  clear to auscultation bilaterally, no increased work of breathing  CVS:   regular rate and rhythm, no murmur, distal pulses intact  ABD:  soft, non-tender; bowel sounds present; no palpable masses EXT:   normal ROM, atraumatic, no lower extremity edema  NEURO:  speech normal, alert and oriented CN 2-12 grossly intact, strength 5/5 bilateral upper and lower extremities, sensation grossly intact Skin:   warm and dry, no rash, normal skin turgor Psych: Normal affect and thought content    Labs and Imaging: CBC BMET  Recent Labs  Lab 11/21/19 2058  WBC 7.1  HGB 14.0  HCT 43.3  PLT 299   Recent Labs  Lab 11/21/19 2058  NA 137  K 3.2*  CL 102  CO2 24  BUN 9  CREATININE 0.69  GLUCOSE 109*  CALCIUM 9.2     EKG: EKG Interpretation  Date/Time:  Sunday November 21 2019 20:41:55 EDT Ventricular Rate:  73 PR Interval:  146 QRS Duration: 102 QT Interval:  414 QTC Calculation: 456 R Axis:   -44 Text Interpretation: Normal sinus rhythm with sinus arrhythmia Possible Left atrial enlargement Left axis deviation Incomplete right bundle branch block Left ventricular hypertrophy ( R in aVL , Cornell product , Romhilt-Estes ) Cannot rule out Septal infarct , age undetermined T wave abnormality, consider inferolateral ischemia Abnormal ECG Artifact Nonspecific T wave abnormality Confirmed by Glynn Octave  [phone]) on 11/22/2019 12:03:34 AM   CT Angio Head W or Wo Contrast  Result Date: 11/22/2019 CLINICAL DATA:  Initial evaluation for acute headache. EXAM: CT ANGIOGRAPHY HEAD AND NECK TECHNIQUE: Multidetector CT imaging of the head and neck was performed using the standard protocol during bolus administration of intravenous contrast. Multiplanar CT image reconstructions and MIPs were obtained to evaluate the vascular anatomy. Carotid stenosis measurements (when applicable) are obtained utilizing NASCET criteria, using the distal internal carotid diameter as the denominator. CONTRAST:  OMNIPAQUE IOHEXOL 350 MG/ML SOLN COMPARISON:  Prior head CT from earlier the same day. FINDINGS: CTA NECK FINDINGS Aortic arch: Visualized aortic arch of normal caliber with normal branch pattern. Moderate atheromatous change about the arch and origin of the great vessels without hemodynamically significant stenosis. Right carotid system: Right CCA mildly tortuous but widely patent to the bifurcation without stenosis. Mild eccentric plaque at the origin of the right ICA without hemodynamically significant stenosis. Distal right ICA tortuous with mild atheromatous change without high-grade stenosis. Left carotid system: Left CCA mildly tortuous but widely patent to the bifurcation without stenosis. Scattered calcified plaque about the left bifurcation without hemodynamically significant stenosis. Left ICA widely patent distally to the skull base without stenosis, dissection or occlusion. Vertebral arteries: Both vertebral arteries arise from the subclavian arteries. Short-segment 30% stenosis at the origin of the right subclavian artery from the right brachiocephalic artery (series 7, image 285). No other proximal subclavian stenosis. Vertebral arteries mildly tortuous proximally but are widely patent to the skull base without stenosis, dissection or occlusion. Skeleton: No acute osseous abnormality. No discrete or worrisome  osseous lesions. Other neck: No other acute soft tissue abnormality within the neck. No mass lesion or adenopathy. Upper chest: No abnormality identified within the visualized upper chest. Review of the MIP images confirms the above findings CTA HEAD FINDINGS Anterior circulation: Petrous segments patent bilaterally. Scattered atheromatous plaque within the cavernous/supraclinoid ICAs with no more than mild multifocal stenosis. A1 segments widely patent. Normal anterior  communicating artery complex. Anterior cerebral arteries widely patent to their distal aspects without stenosis. No M1 stenosis or occlusion. There is a focal inferior division, consistent with a small aneurysm (series 8, image 91). This is directed laterally. Left MCA bifurcation within normal limits. Distal MCA branches well perfused and symmetric. Posterior circulation: Vertebral arteries widely patent to the vertebrobasilar junction without stenosis. Left vertebral artery dominant. Both picas are patent. Basilar widely patent to its distal aspect without stenosis. Superior cerebral arteries patent bilaterally. Both PCAs well perfused to their distal aspects without stenosis. Venous sinuses: Grossly patent allowing for timing the contrast bolus. Left transverse and sigmoid sinuses are markedly hypoplastic. Anatomic variants: None significant. Review of the MIP images confirms the above findings IMPRESSION: 1. Negative CTA for emergent large vessel occlusion. No high-grade or correctable stenosis identified. No other acute vascular abnormality. 2. Mild to moderate atheromatous change about the aortic arch, carotid bifurcations, and carotid siphons without hemodynamically significant or correctable stenosis. 3. 2 mm right MCA bifurcation aneurysm as above. Electronically Signed   By: Rise Mu M.D.   On: 11/22/2019 02:54   CT Head Wo Contrast  Result Date: 11/21/2019 CLINICAL DATA:  Generalized malaise. EXAM: CT HEAD WITHOUT CONTRAST  TECHNIQUE: Contiguous axial images were obtained from the base of the skull through the vertex without intravenous contrast. COMPARISON:  July 04, 2017 FINDINGS: Brain: No evidence of acute infarction, hemorrhage, hydrocephalus, extra-axial collection or mass lesion/mass effect. Vascular: No hyperdense vessel or unexpected calcification. Skull: Normal. Negative for fracture or focal lesion. Sinuses/Orbits: No acute finding. Other: None. IMPRESSION: No acute intracranial pathology. Electronically Signed   By: Aram Candela M.D.   On: 11/21/2019 21:26   CT Angio Neck W and/or Wo Contrast  Result Date: 11/22/2019 CLINICAL DATA:  Initial evaluation for acute headache. EXAM: CT ANGIOGRAPHY HEAD AND NECK TECHNIQUE: Multidetector CT imaging of the head and neck was performed using the standard protocol during bolus administration of intravenous contrast. Multiplanar CT image reconstructions and MIPs were obtained to evaluate the vascular anatomy. Carotid stenosis measurements (when applicable) are obtained utilizing NASCET criteria, using the distal internal carotid diameter as the denominator. CONTRAST:  OMNIPAQUE IOHEXOL 350 MG/ML SOLN COMPARISON:  Prior head CT from earlier the same day. FINDINGS: CTA NECK FINDINGS Aortic arch: Visualized aortic arch of normal caliber with normal branch pattern. Moderate atheromatous change about the arch and origin of the great vessels without hemodynamically significant stenosis. Right carotid system: Right CCA mildly tortuous but widely patent to the bifurcation without stenosis. Mild eccentric plaque at the origin of the right ICA without hemodynamically significant stenosis. Distal right ICA tortuous with mild atheromatous change without high-grade stenosis. Left carotid system: Left CCA mildly tortuous but widely patent to the bifurcation without stenosis. Scattered calcified plaque about the left bifurcation without hemodynamically significant stenosis. Left ICA  widely patent distally to the skull base without stenosis, dissection or occlusion. Vertebral arteries: Both vertebral arteries arise from the subclavian arteries. Short-segment 30% stenosis at the origin of the right subclavian artery from the right brachiocephalic artery (series 7, image 285). No other proximal subclavian stenosis. Vertebral arteries mildly tortuous proximally but are widely patent to the skull base without stenosis, dissection or occlusion. Skeleton: No acute osseous abnormality. No discrete or worrisome osseous lesions. Other neck: No other acute soft tissue abnormality within the neck. No mass lesion or adenopathy. Upper chest: No abnormality identified within the visualized upper chest. Review of the MIP images confirms the above findings CTA  HEAD FINDINGS Anterior circulation: Petrous segments patent bilaterally. Scattered atheromatous plaque within the cavernous/supraclinoid ICAs with no more than mild multifocal stenosis. A1 segments widely patent. Normal anterior communicating artery complex. Anterior cerebral arteries widely patent to their distal aspects without stenosis. No M1 stenosis or occlusion. There is a focal inferior division, consistent with a small aneurysm (series 8, image 91). This is directed laterally. Left MCA bifurcation within normal limits. Distal MCA branches well perfused and symmetric. Posterior circulation: Vertebral arteries widely patent to the vertebrobasilar junction without stenosis. Left vertebral artery dominant. Both picas are patent. Basilar widely patent to its distal aspect without stenosis. Superior cerebral arteries patent bilaterally. Both PCAs well perfused to their distal aspects without stenosis. Venous sinuses: Grossly patent allowing for timing the contrast bolus. Left transverse and sigmoid sinuses are markedly hypoplastic. Anatomic variants: None significant. Review of the MIP images confirms the above findings IMPRESSION: 1. Negative CTA for  emergent large vessel occlusion. No high-grade or correctable stenosis identified. No other acute vascular abnormality. 2. Mild to moderate atheromatous change about the aortic arch, carotid bifurcations, and carotid siphons without hemodynamically significant or correctable stenosis. 3. 2 mm right MCA bifurcation aneurysm as above. Electronically Signed   By: Rise Mu M.D.   On: 11/22/2019 02:54     Katha Cabal, DO 11/22/2019, 3:49 AM PGY-1, [facility] FPTS Intern pager: [phone], text pages welcome  FPTS Upper-Level Resident Addendum   I have independently interviewed and examined the patient. I have discussed the above with the original author and agree with their documentation. My edits for correction/addition/clarification are in blue. Please see also any attending notes.    Mirian Mo MD PGY-2, [facility] 11/22/2019 8:14 AM  FPTS Service pager: [phone] (text pages welcome through [facility])

## 2019-11-22 NOTE — Progress Notes (Signed)
FPTS Interim Progress Note  S:RN paged regarding patient's BP.  Patient having "pressure" and "squeezing" at the top of her head.  She states she took Nicotine tab this afternoon.  She uses them occasionally since she quit smoking.   O: BP (!) 181/80   Pulse 77   Temp 97.7 F (36.5 C) (Oral)   Resp 19   Ht 5\' 2"  (1.575 m)   Wt 74.4 kg Comment:  scale C  LMP  (LMP Unknown)   SpO2 100%   BMI 30.00 kg/m    GEN: well appearing, watching TV in bed  CVS: regular rate and rhythm, no murmur, rubs or gallops  RESP: no increased work of breathing, lungs clear  NEURO: alert and oriented, CN 2-12 grossly intact, strength 5/5 upper and lower extremities, gross sensation intact   A/P: Uncontrolled HTN with Headache  Neuro exam at baseline.  Patient with known cerebral aneurysm.   - Obtain CT Head w/o contrast - Labetalol 20 mg   Tobacco use disorder  - low dose nicotine patch available   , DO 11/22/2019, 9:56 PM PGY-1, [facility] Service pager [phone]

## 2019-11-22 NOTE — ED Provider Notes (Signed)
[facility] EMERGENCY DEPARTMENT Provider Note   CSN: [id number] Arrival date & time: 11/21/19  2013     History Chief Complaint  Patient presents with  . Hypertension    Candice Evans is a 54 y.o. female.  Patient with history of hypertension on no medications for the past 1 year here with elevated blood pressure, headache, nausea and vomiting.  States symptoms started last evening.  On the evening of June 19, she developed nausea with one episode of vomiting.  When she woke up on the morning of the 20th she had a dull headache with one episode of vomiting and nausea.  Headache has progressively worsened throughout the day.  Denies thunderclap onset.  Denies sudden onset with vomiting.  States her blood pressure at home has been elevated and she is not had her medications for more than 1 year.  Did use some marijuana at home but denies any recent cocaine use.  No chest pain or shortness of breath.  No focal weakness, numbness or tingling.  No visual changes.  Her vision is poor at baseline.  She denies any focal weakness.  Denies any chest pain.  Denies headache suddenly worsening during vomiting.  Headache is dull and diffuse.  The history is provided by the patient.  Hypertension Associated symptoms include headaches. Pertinent negatives include no chest pain, no abdominal pain and no shortness of breath.       Past Medical History:  Diagnosis Date  . Eczema   . HTN (hypertension)   . Hyperthyroidism     Patient Active Problem List   Diagnosis Date Noted  . Stress reaction 07/04/2017  . Encounter for smoking cessation counseling 07/04/2016  . Family history of cardiovascular disease 12/21/2014  . History of cocaine abuse (HCC) 02/18/2013  . Dental abscess 08/30/2012  . Tobacco abuse counseling 08/26/2012  . Hyperthyroidism 07/31/2006  . HYPERTENSION, BENIGN SYSTEMIC 07/31/2006  . Eczema 07/31/2006    Past Surgical History:  Procedure Laterality Date   . CARDIAC CATHETERIZATION     LAD 50% stenosed, no stent     OB History   No obstetric history on file.     Family History  Problem Relation Age of Onset  . Hypertension Father   . Coronary artery disease Father   . Heart failure Father        Died age 44  . Heart failure Brother        Died age 31  . Hypertension Mother     Social History   Tobacco Use  . Smoking status: Former Smoker    Packs/day: 0.50    Years: 34.00    Pack years: 17.00    Types: Cigarettes    Start date: 06/04/1979  . Smokeless tobacco: Never Used  . Tobacco comment: wants to quit, 06-29-2017 per pt she stopped Jan 2017  Vaping Use  . Vaping Use: Never used  Substance Use Topics  . Alcohol use: No    Alcohol/week: 0.0 standard drinks  . Drug use: No    Frequency: 0.2 times per week    Types: Marijuana    Comment: former use    Home Medications Prior to Admission medications   Medication Sig Start Date End Date Taking? Authorizing Provider  amLODipine (NORVASC) 5 MG tablet Take 1 tablet (5 mg total) by mouth daily. 08/20/18   Shon Hale, MD  aspirin EC 81 MG tablet Take 1 tablet (81 mg total) by mouth daily. 12/20/14  McKeag, Marylynn Pearson, MD  benzonatate (TESSALON) 100 MG capsule Take 1-2 capsules (100-200 mg total) by mouth 3 (three) times daily as needed for cough. 06/29/17   Robyn Haber, MD  [employer] NICOTINE POLACRILEX 4 MG lozenge TAKE 1 LOZENGE BY MOUTH AS NEEDED FOR SMOKING CESSATION 11/04/16   McKeag, Marylynn Pearson, MD  cyclobenzaprine (FLEXERIL) 10 MG tablet Take 1 tablet (10 mg total) by mouth 2 (two) times daily as needed for muscle spasms. 07/04/17   Ashley Murrain, NP  fluticasone (FLONASE) 50 MCG/ACT nasal spray Place 2 sprays into both nostrils daily. 06/02/15   Mariel Aloe, MD  lisinopril-hydrochlorothiazide (PRINZIDE,ZESTORETIC) 20-25 MG tablet Take 1 tablet by mouth daily. 06/29/17   Robyn Haber, MD  mometasone (ELOCON) 0.1 % ointment APPLY  OINTMENT TOPICALLY TO AFFECTED AREA  ONCE DAILY 02/10/19   Benay Pike, MD  naproxen (NAPROSYN) 500 MG tablet Take 1 tablet (500 mg total) by mouth 2 (two) times daily. 07/04/17   Ashley Murrain, NP  triamcinolone cream (KENALOG) 0.1 % APPLY  CREAM EXTERNALLY TO AFFECTED AREA TWICE DAILY 12/10/16   McKeag, Marylynn Pearson, MD    Allergies    Bupropion  Review of Systems   Review of Systems  Constitutional: Negative for activity change, appetite change, fatigue and fever.  HENT: Negative for congestion and rhinorrhea.   Eyes: Negative for photophobia and visual disturbance.  Respiratory: Negative for cough, chest tightness and shortness of breath.   Cardiovascular: Negative for chest pain.  Gastrointestinal: Positive for nausea and vomiting. Negative for abdominal pain.  Genitourinary: Negative for dysuria, flank pain and hematuria.  Musculoskeletal: Negative for arthralgias, back pain and myalgias.  Skin: Negative for wound.  Neurological: Positive for dizziness, light-headedness and headaches. Negative for weakness.   all other systems are negative except as noted in the HPI and PMH.    Physical Exam Updated Vital Signs BP (!) 234/101 (BP Location: Left Arm)   Pulse 84   Temp 98.8 F (37.1 C) (Oral)   Resp 20   Ht 5\' 4"  (1.626 m)   Wt 74.4 kg   LMP  (LMP Unknown)   SpO2 97%   BMI 28.15 kg/m   Physical Exam Vitals and nursing note reviewed.  Constitutional:      General: She is not in acute distress.    Appearance: Normal appearance. She is well-developed and normal weight. She is not ill-appearing.  HENT:     Head: Normocephalic and atraumatic.     Mouth/Throat:     Pharynx: No oropharyngeal exudate.  Eyes:     Conjunctiva/sclera: Conjunctivae normal.     Pupils: Pupils are equal, round, and reactive to light.  Neck:     Comments: No meningismus. Cardiovascular:     Rate and Rhythm: Normal rate and regular rhythm.     Heart sounds: Normal heart sounds. No murmur heard.   Pulmonary:     Effort: Pulmonary  effort is normal. No respiratory distress.     Breath sounds: Normal breath sounds.  Chest:     Chest wall: No tenderness.  Abdominal:     Palpations: Abdomen is soft.     Tenderness: There is no abdominal tenderness. There is no guarding or rebound.  Musculoskeletal:        General: No tenderness. Normal range of motion.     Cervical back: Normal range of motion and neck supple.  Skin:    General: Skin is warm.  Neurological:     General: No  focal deficit present.     Mental Status: She is alert and oriented to person, place, and time. Mental status is at baseline.     Cranial Nerves: No cranial nerve deficit.     Motor: No abnormal muscle tone.     Coordination: Coordination normal.     Comments: CN 2-12 intact, no ataxia on finger to nose, no nystagmus, 5/5 strength throughout, no pronator drift,  Negative romberg. Normal gait.  No pronator drift, no ataxia on finger to nose.  Psychiatric:        Behavior: Behavior normal.     ED Results / Procedures / Treatments   Labs (all labs ordered are listed, but only abnormal results are displayed) Labs Reviewed  BASIC METABOLIC PANEL - Abnormal; Notable for the following components:      Result Value   Potassium 3.2 (*)    Glucose, Bld 109 (*)    All other components within normal limits  BASIC METABOLIC PANEL - Abnormal; Notable for the following components:   Potassium 2.9 (*)    All other components within normal limits  LIPID PANEL - Abnormal; Notable for the following components:   LDL Cholesterol 109 (*)    All other components within normal limits  SARS CORONAVIRUS 2 BY RT PCR (HOSPITAL ORDER, PERFORMED IN [facility])  CBC  HEPATIC FUNCTION PANEL  CBC  TSH  MAGNESIUM  RAPID URINE DRUG SCREEN, HOSP PERFORMED  URINALYSIS, ROUTINE W REFLEX MICROSCOPIC  HIV ANTIBODY (ROUTINE TESTING W REFLEX)  HEMOGLOBIN A1C  I-STAT BETA HCG BLOOD, ED ([facility], [facility], [facility] ONLY)  TROPONIN I (HIGH SENSITIVITY)  TROPONIN I (HIGH  SENSITIVITY)  TROPONIN I (HIGH SENSITIVITY)    EKG EKG Interpretation  Date/Time:  Sunday November 21 2019 20:41:55 EDT Ventricular Rate:  73 PR Interval:  146 QRS Duration: 102 QT Interval:  414 QTC Calculation: 456 R Axis:   -44 Text Interpretation: Normal sinus rhythm with sinus arrhythmia Possible Left atrial enlargement Left axis deviation Incomplete right bundle branch block Left ventricular hypertrophy ( R in aVL , Cornell product , Romhilt-Estes ) Cannot rule out Septal infarct , age undetermined T wave abnormality, consider inferolateral ischemia Abnormal ECG Artifact Nonspecific T wave abnormality Confirmed by Glynn Octaveancour, Pacen [place] [id number]) on 11/22/2019 12:03:34 AM   Radiology CT Angio Head W or Wo Contrast  Result Date: 11/22/2019 CLINICAL DATA:  Initial evaluation for acute headache. EXAM: CT ANGIOGRAPHY HEAD AND NECK TECHNIQUE: Multidetector CT imaging of the head and neck was performed using the standard protocol during bolus administration of intravenous contrast. Multiplanar CT image reconstructions and MIPs were obtained to evaluate the vascular anatomy. Carotid stenosis measurements (when applicable) are obtained utilizing NASCET criteria, using the distal internal carotid diameter as the denominator. CONTRAST:  100mL OMNIPAQUE IOHEXOL 350 MG/ML SOLN COMPARISON:  Prior head CT from earlier the same day. FINDINGS: CTA NECK FINDINGS Aortic arch: Visualized aortic arch of normal caliber with normal branch pattern. Moderate atheromatous change about the arch and origin of the great vessels without hemodynamically significant stenosis. Right carotid system: Right CCA mildly tortuous but widely patent to the bifurcation without stenosis. Mild eccentric plaque at the origin of the right ICA without hemodynamically significant stenosis. Distal right ICA tortuous with mild atheromatous change without high-grade stenosis. Left carotid system: Left CCA mildly tortuous but widely patent to the  bifurcation without stenosis. Scattered calcified plaque about the left bifurcation without hemodynamically significant stenosis. Left ICA widely patent distally to the skull base without stenosis,  dissection or occlusion. Vertebral arteries: Both vertebral arteries arise from the subclavian arteries. Short-segment 30% stenosis at the origin of the right subclavian artery from the right brachiocephalic artery (series 7, image 285). No other proximal subclavian stenosis. Vertebral arteries mildly tortuous proximally but are widely patent to the skull base without stenosis, dissection or occlusion. Skeleton: No acute osseous abnormality. No discrete or worrisome osseous lesions. Other neck: No other acute soft tissue abnormality within the neck. No mass lesion or adenopathy. Upper chest: No abnormality identified within the visualized upper chest. Review of the MIP images confirms the above findings CTA HEAD FINDINGS Anterior circulation: Petrous segments patent bilaterally. Scattered atheromatous plaque within the cavernous/supraclinoid ICAs with no more than mild multifocal stenosis. A1 segments widely patent. Normal anterior communicating artery complex. Anterior cerebral arteries widely patent to their distal aspects without stenosis. No M1 stenosis or occlusion. There is a focal inferior division, consistent with a small aneurysm (series 8, image 91). This is directed laterally. Left MCA bifurcation within normal limits. Distal MCA branches well perfused and symmetric. Posterior circulation: Vertebral arteries widely patent to the vertebrobasilar junction without stenosis. Left vertebral artery dominant. Both picas are patent. Basilar widely patent to its distal aspect without stenosis. Superior cerebral arteries patent bilaterally. Both PCAs well perfused to their distal aspects without stenosis. Venous sinuses: Grossly patent allowing for timing the contrast bolus. Left transverse and sigmoid sinuses are  markedly hypoplastic. Anatomic variants: None significant. Review of the MIP images confirms the above findings IMPRESSION: 1. Negative CTA for emergent large vessel occlusion. No high-grade or correctable stenosis identified. No other acute vascular abnormality. 2. Mild to moderate atheromatous change about the aortic arch, carotid bifurcations, and carotid siphons without hemodynamically significant or correctable stenosis. 3. 2 mm right MCA bifurcation aneurysm as above. Electronically Signed   By: Rise Mu M.D.   On: 11/22/2019 02:54   CT Head Wo Contrast  Result Date: 11/21/2019 CLINICAL DATA:  Generalized malaise. EXAM: CT HEAD WITHOUT CONTRAST TECHNIQUE: Contiguous axial images were obtained from the base of the skull through the vertex without intravenous contrast. COMPARISON:  July 04, 2017 FINDINGS: Brain: No evidence of acute infarction, hemorrhage, hydrocephalus, extra-axial collection or mass lesion/mass effect. Vascular: No hyperdense vessel or unexpected calcification. Skull: Normal. Negative for fracture or focal lesion. Sinuses/Orbits: No acute finding. Other: None. IMPRESSION: No acute intracranial pathology. Electronically Signed   By: Aram Candela M.D.   On: 11/21/2019 21:26   CT Angio Neck W and/or Wo Contrast  Result Date: 11/22/2019 CLINICAL DATA:  Initial evaluation for acute headache. EXAM: CT ANGIOGRAPHY HEAD AND NECK TECHNIQUE: Multidetector CT imaging of the head and neck was performed using the standard protocol during bolus administration of intravenous contrast. Multiplanar CT image reconstructions and MIPs were obtained to evaluate the vascular anatomy. Carotid stenosis measurements (when applicable) are obtained utilizing NASCET criteria, using the distal internal carotid diameter as the denominator. CONTRAST:  OMNIPAQUE IOHEXOL 350 MG/ML SOLN COMPARISON:  Prior head CT from earlier the same day. FINDINGS: CTA NECK FINDINGS Aortic arch: Visualized  aortic arch of normal caliber with normal branch pattern. Moderate atheromatous change about the arch and origin of the great vessels without hemodynamically significant stenosis. Right carotid system: Right CCA mildly tortuous but widely patent to the bifurcation without stenosis. Mild eccentric plaque at the origin of the right ICA without hemodynamically significant stenosis. Distal right ICA tortuous with mild atheromatous change without high-grade stenosis. Left carotid system: Left CCA mildly tortuous but widely  patent to the bifurcation without stenosis. Scattered calcified plaque about the left bifurcation without hemodynamically significant stenosis. Left ICA widely patent distally to the skull base without stenosis, dissection or occlusion. Vertebral arteries: Both vertebral arteries arise from the subclavian arteries. Short-segment 30% stenosis at the origin of the right subclavian artery from the right brachiocephalic artery (series 7, image 285). No other proximal subclavian stenosis. Vertebral arteries mildly tortuous proximally but are widely patent to the skull base without stenosis, dissection or occlusion. Skeleton: No acute osseous abnormality. No discrete or worrisome osseous lesions. Other neck: No other acute soft tissue abnormality within the neck. No mass lesion or adenopathy. Upper chest: No abnormality identified within the visualized upper chest. Review of the MIP images confirms the above findings CTA HEAD FINDINGS Anterior circulation: Petrous segments patent bilaterally. Scattered atheromatous plaque within the cavernous/supraclinoid ICAs with no more than mild multifocal stenosis. A1 segments widely patent. Normal anterior communicating artery complex. Anterior cerebral arteries widely patent to their distal aspects without stenosis. No M1 stenosis or occlusion. There is a focal inferior division, consistent with a small aneurysm (series 8, image 91). This is directed laterally. Left  MCA bifurcation within normal limits. Distal MCA branches well perfused and symmetric. Posterior circulation: Vertebral arteries widely patent to the vertebrobasilar junction without stenosis. Left vertebral artery dominant. Both picas are patent. Basilar widely patent to its distal aspect without stenosis. Superior cerebral arteries patent bilaterally. Both PCAs well perfused to their distal aspects without stenosis. Venous sinuses: Grossly patent allowing for timing the contrast bolus. Left transverse and sigmoid sinuses are markedly hypoplastic. Anatomic variants: None significant. Review of the MIP images confirms the above findings IMPRESSION: 1. Negative CTA for emergent large vessel occlusion. No high-grade or correctable stenosis identified. No other acute vascular abnormality. 2. Mild to moderate atheromatous change about the aortic arch, carotid bifurcations, and carotid siphons without hemodynamically significant or correctable stenosis. 3. 2 mm right MCA bifurcation aneurysm as above. Electronically Signed   By: Rise Mu M.D.   On: 11/22/2019 02:54    Procedures .Critical Care Performed by: Glynn Octave, MD Authorized by: Glynn Octave, MD   Critical care provider statement:    Critical care time (minutes):  45   Critical care was necessary to treat or prevent imminent or life-threatening deterioration of the following conditions: hypertensive emergency.   Critical care was time spent personally by me on the following activities:  Discussions with consultants, evaluation of patient's response to treatment, examination of patient, ordering and performing treatments and interventions, ordering and review of laboratory studies, ordering and review of radiographic studies, pulse oximetry, re-evaluation of patient's condition, obtaining history from patient or surrogate and review of old charts   (including critical care time)  Medications Ordered in ED Medications  sodium  chloride flush (NS) 0.9 % injection 3 mL (has no administration in time range)  metoCLOPramide (REGLAN) injection 10 mg (has no administration in time range)  diphenhydrAMINE (BENADRYL) injection 25 mg (has no administration in time range)  labetalol (NORMODYNE) injection 20 mg (has no administration in time range)    ED Course  I have reviewed the triage vital signs and the nursing notes.  Pertinent labs & imaging results that were available during my care of the patient were reviewed by me and considered in my medical decision making (see chart for details).    MDM Rules/Calculators/A&P  Headache with nausea, vomiting. Nonfocal neuro exam. Denies thunderclap onset.  Headache was gradual onset in the morning of June 20.  CT head obtained in triage is negative.  Patient remains hypertensive greater than 220 systolic.  She is given IV labetalol as well as her home medications including amlodipine and lisinopril.  She denies chest pain or shortness of breath.  Neurological exam is nonfocal.  She denies thunderclap onset headache.  CTA is negative for large vessel occlusion.  Does show a small MCA aneurysm.  Blood pressure remains elevated at 218/90 despite receiving IV labetalol, p.o. amlodipine and lisinopril.  Headache is starting to improve.  No chest pain.  Troponins remain negative.  Patient states headache was gradual in onset and was not thunderclap in origin.  Low suspicion for subarachnoid hemorrhage.  Nonfocal neurological exam.  Patient given additional labetalol and hydralazine.  Blood pressure remains uncontrolled.  Headache is gradually improving.  CTA as above.  Admission for hypertensive emergency with ongoing headache discussed with family practice residents. Final Clinical Impression(s) / ED Diagnoses Final diagnoses:  None    Rx / DC Orders ED Discharge Orders    None       Deland Slocumb, Jeannett Senior, MD 11/22/19 0700

## 2019-11-22 NOTE — ED Notes (Signed)
Admitting at bedside 

## 2019-11-23 ENCOUNTER — Inpatient Hospital Stay (HOSPITAL_COMMUNITY): Payer: No Typology Code available for payment source

## 2019-11-23 DIAGNOSIS — I160 Hypertensive urgency: Principal | ICD-10-CM

## 2019-11-23 DIAGNOSIS — I998 Other disorder of circulatory system: Secondary | ICD-10-CM

## 2019-11-23 DIAGNOSIS — R519 Headache, unspecified: Secondary | ICD-10-CM

## 2019-11-23 LAB — BASIC METABOLIC PANEL
Anion gap: 12 (ref 5–15)
BUN: 16 mg/dL (ref 6–20)
CO2: 22 mmol/L (ref 22–32)
Calcium: 8.9 mg/dL (ref 8.9–10.3)
Chloride: 103 mmol/L (ref 98–111)
Creatinine, Ser: 0.78 mg/dL (ref 0.44–1.00)
GFR calc Af Amer: >60 mL/min (ref >60)
GFR calc non Af Amer: >60 mL/min (ref >60)
Glucose, Bld: 93 mg/dL (ref 70–99)
Potassium: 3.8 mmol/L (ref 3.5–5.1)
Sodium: 137 mmol/L (ref 135–145)

## 2019-11-23 MED ORDER — AMLODIPINE BESYLATE 10 MG PO TABS: 5.0000 mg | ORAL_TABLET | Freq: Every day | ORAL | 0 refills | Status: DC

## 2019-11-23 MED ORDER — LOSARTAN POTASSIUM 50 MG PO TABS
50.0000 mg | ORAL_TABLET | Freq: Every day | ORAL | Status: DC
  Administered 2019-11-23: 50 mg via ORAL
  Filled 2019-11-23: qty 1

## 2019-11-23 MED ORDER — AMLODIPINE BESYLATE 10 MG PO TABS: 10.0000 mg | ORAL_TABLET | Freq: Every day | ORAL | 0 refills | Status: DC

## 2019-11-23 MED ORDER — HYDROCHLOROTHIAZIDE 25 MG PO TABS
25.0000 mg | ORAL_TABLET | Freq: Every day | ORAL | Status: DC
  Administered 2019-11-23: 25 mg via ORAL
  Filled 2019-11-23: qty 1

## 2019-11-23 MED ORDER — SODIUM CHLORIDE 0.9 % IV SOLN: INTRAVENOUS | Status: DC

## 2019-11-23 MED ORDER — LOSARTAN POTASSIUM 50 MG PO TABS: 50.0000 mg | ORAL_TABLET | Freq: Every day | ORAL | 0 refills | Status: DC

## 2019-11-23 MED ORDER — HYDROCHLOROTHIAZIDE 25 MG PO TABS: 25.0000 mg | ORAL_TABLET | Freq: Every day | ORAL | 0 refills | Status: DC

## 2019-11-23 MED FILL — AMLODIPINE BESYLATE 10 MG T: 10 | 60 days supply | Qty: 30 | Fill #0

## 2019-11-23 MED FILL — LOSARTAN POTASSIUM 50 MG TA: 50 | 30 days supply | Qty: 30 | Fill #0

## 2019-11-23 MED FILL — AMLODIPINE BESYLATE 10 MG T: 10 | 30 days supply | Qty: 30 | Fill #0

## 2019-11-23 MED FILL — HYDROCHLOROTHIAZIDE 25 MG T: 25 | 30 days supply | Qty: 30 | Fill #0

## 2019-11-23 NOTE — Discharge Instructions (Signed)
You were hospitalized for significantly elevated blood pressures.  While you are here in the hospital, we reduced your blood pressure by starting you on 3 medications.  We are going to send you home on amlodipine, hydrochlorothiazide and losartan.  Please remember to take these medications every day.  The losartan can also be helpful for migraines and may help with the headaches even mentioning.  We also performed head imaging while you were here which showed no abnormalities, strokes or concerning findings.  Please follow-up with your primary care provider, Dr. Constance Goltz who will continue to work with you to improve your blood pressure.  If your blood pressure remains high over an extended time (the course of years) this can lead to eventual strokes and heart attacks.

## 2019-11-23 NOTE — Progress Notes (Signed)
Patient stated she was unaware of continued need to measure output and had removed measuring hat from commode. Educated patient on importance of measuring output. Patient stated she would comply with measuring.

## 2019-11-23 NOTE — Discharge Summary (Addendum)
Family Medicine Teaching [facility] Discharge Summary  Patient name: Candice Evans Medical record number: [id number] Date of birth: 1966/04/08 Age: 54 y.o. Gender: female Date of Admission: 11/21/2019  Date of Discharge: 11/23/2019 Admitting Physician: Katha Cabal, DO  Primary Care Provider: Sandre Kitty, MD Consultants: None  Indication for Hospitalization: Headache, uncontrolled hypertension  Discharge Diagnoses/Problem List:  Principal Problem:   Poorly controlled blood pressure Active Problems:   Hypertensive urgency  Disposition: Home  Discharge Condition: Stable  Discharge Exam:  General: Alert and oriented in no apparent distress Heart: Regular rate and rhythm with no murmurs appreciated Lungs: CTA bilaterally, no wheezing Extremities: No lower extremity edema  Brief Hospital Course:  Patient presented to the emergency department with a headache and found to have greatly elevated blood pressures in the 230s over 100s. On the night of 6/21 patient had a worsening of her headache and a head CT was performed which showed no acute abnormality. Patients blood pressure was controlled with IV hydralazine and labetalol over the initial hospitalization. On 11/23/2019 patient was restarted on some oral blood pressure control including losartan, amlodipine 10, and hydrochlorothiazide. Patient's initial headache had resolved by this point. At the time of discharge patient's blood pressure had improved to 161/85. Patient was discharged on losartan, amlodipine, hydrochlorothiazide with close clinic follow-up scheduled.  Issues for Follow Up:  1. Patient needs a BMP at follow-up as recently started losartan and hydrochlorthiazide 2. Recommend consider adding a statin to patient's meds 3. Consider work-up for secondary hypertension  Significant Procedures: None  Significant Labs and Imaging:  Recent Labs  Lab 11/21/19 2058 11/22/19 0518  WBC 7.1 9.3  HGB 14.0 14.0  HCT  43.3 41.8  PLT 299 283   Recent Labs  Lab 11/21/19 2058 11/21/19 2058 11/22/19 0359 11/22/19 0359 11/22/19 1521 11/23/19 0503  NA 137  --  137  --  138 137  K 3.2*   < > 2.9*   < > 4.4 3.8  CL 102  --  102  --  103 103  CO2 24  --  23  --  27 22  GLUCOSE 109*  --  95  --  93 93  BUN 9  --  9  --  16 16  CREATININE 0.69  --  0.58  --  1.10* 0.78  CALCIUM 9.2  --  9.3  --  9.1 8.9  MG  --   --  2.1  --   --   --   ALKPHOS  --   --  92  --   --   --   AST  --   --  16  --   --   --   ALT  --   --  18  --   --   --   ALBUMIN  --   --  3.7  --   --   --    < > = values in this interval not displayed.     Results/Tests Pending at Time of Discharge: CT Angio Head W or Wo Contrast  Result Date: 11/22/2019 CLINICAL DATA:  Initial evaluation for acute headache. EXAM: CT ANGIOGRAPHY HEAD AND NECK TECHNIQUE: Multidetector CT imaging of the head and neck was performed using the standard protocol during bolus administration of intravenous contrast. Multiplanar CT image reconstructions and MIPs were obtained to evaluate the vascular anatomy. Carotid stenosis measurements (when applicable) are obtained utilizing NASCET criteria, using the distal internal carotid diameter as the  denominator. CONTRAST:  OMNIPAQUE IOHEXOL 350 MG/ML SOLN COMPARISON:  Prior head CT from earlier the same day. FINDINGS: CTA NECK FINDINGS Aortic arch: Visualized aortic arch of normal caliber with normal branch pattern. Moderate atheromatous change about the arch and origin of the great vessels without hemodynamically significant stenosis. Right carotid system: Right CCA mildly tortuous but widely patent to the bifurcation without stenosis. Mild eccentric plaque at the origin of the right ICA without hemodynamically significant stenosis. Distal right ICA tortuous with mild atheromatous change without high-grade stenosis. Left carotid system: Left CCA mildly tortuous but widely patent to the bifurcation without stenosis.  Scattered calcified plaque about the left bifurcation without hemodynamically significant stenosis. Left ICA widely patent distally to the skull base without stenosis, dissection or occlusion. Vertebral arteries: Both vertebral arteries arise from the subclavian arteries. Short-segment 30% stenosis at the origin of the right subclavian artery from the right brachiocephalic artery (series 7, image 285). No other proximal subclavian stenosis. Vertebral arteries mildly tortuous proximally but are widely patent to the skull base without stenosis, dissection or occlusion. Skeleton: No acute osseous abnormality. No discrete or worrisome osseous lesions. Other neck: No other acute soft tissue abnormality within the neck. No mass lesion or adenopathy. Upper chest: No abnormality identified within the visualized upper chest. Review of the MIP images confirms the above findings CTA HEAD FINDINGS Anterior circulation: Petrous segments patent bilaterally. Scattered atheromatous plaque within the cavernous/supraclinoid ICAs with no more than mild multifocal stenosis. A1 segments widely patent. Normal anterior communicating artery complex. Anterior cerebral arteries widely patent to their distal aspects without stenosis. No M1 stenosis or occlusion. There is a focal inferior division, consistent with a small aneurysm (series 8, image 91). This is directed laterally. Left MCA bifurcation within normal limits. Distal MCA branches well perfused and symmetric. Posterior circulation: Vertebral arteries widely patent to the vertebrobasilar junction without stenosis. Left vertebral artery dominant. Both picas are patent. Basilar widely patent to its distal aspect without stenosis. Superior cerebral arteries patent bilaterally. Both PCAs well perfused to their distal aspects without stenosis. Venous sinuses: Grossly patent allowing for timing the contrast bolus. Left transverse and sigmoid sinuses are markedly hypoplastic. Anatomic  variants: None significant. Review of the MIP images confirms the above findings IMPRESSION: 1. Negative CTA for emergent large vessel occlusion. No high-grade or correctable stenosis identified. No other acute vascular abnormality. 2. Mild to moderate atheromatous change about the aortic arch, carotid bifurcations, and carotid siphons without hemodynamically significant or correctable stenosis. 3. 2 mm right MCA bifurcation aneurysm as above. Electronically Signed   By: Rise Mu M.D.   On: 11/22/2019 02:54   CT HEAD WO CONTRAST  Result Date: 11/23/2019 CLINICAL DATA:  New onset headache EXAM: CT HEAD WITHOUT CONTRAST TECHNIQUE: Contiguous axial images were obtained from the base of the skull through the vertex without intravenous contrast. COMPARISON:  None. FINDINGS: Brain: There is no mass, hemorrhage or extra-axial collection. The size and configuration of the ventricles and extra-axial CSF spaces are normal. There is hypoattenuation of the white matter, most commonly indicating chronic small vessel disease. Vascular: No abnormal hyperdensity of the major intracranial arteries or dural venous sinuses. No intracranial atherosclerosis. Skull: The visualized skull base, calvarium and extracranial soft tissues are normal. Sinuses/Orbits: No fluid levels or advanced mucosal thickening of the visualized paranasal sinuses. No mastoid or middle ear effusion. The orbits are normal. IMPRESSION: Chronic small vessel disease without acute intracranial abnormality. Electronically Signed   By: Chrisandra Netters.D.  On: 11/23/2019 00:27   CT Head Wo Contrast  Result Date: 11/21/2019 CLINICAL DATA:  Generalized malaise. EXAM: CT HEAD WITHOUT CONTRAST TECHNIQUE: Contiguous axial images were obtained from the base of the skull through the vertex without intravenous contrast. COMPARISON:  July 04, 2017 FINDINGS: Brain: No evidence of acute infarction, hemorrhage, hydrocephalus, extra-axial collection or mass  lesion/mass effect. Vascular: No hyperdense vessel or unexpected calcification. Skull: Normal. Negative for fracture or focal lesion. Sinuses/Orbits: No acute finding. Other: None. IMPRESSION: No acute intracranial pathology. Electronically Signed   By: Virgina Norfolk M.D.   On: 11/21/2019 21:26   CT Angio Neck W and/or Wo Contrast  Result Date: 11/22/2019 CLINICAL DATA:  Initial evaluation for acute headache. EXAM: CT ANGIOGRAPHY HEAD AND NECK TECHNIQUE: Multidetector CT imaging of the head and neck was performed using the standard protocol during bolus administration of intravenous contrast. Multiplanar CT image reconstructions and MIPs were obtained to evaluate the vascular anatomy. Carotid stenosis measurements (when applicable) are obtained utilizing NASCET criteria, using the distal internal carotid diameter as the denominator. CONTRAST:  161mL OMNIPAQUE IOHEXOL 350 MG/ML SOLN COMPARISON:  Prior head CT from earlier the same day. FINDINGS: CTA NECK FINDINGS Aortic arch: Visualized aortic arch of normal caliber with normal branch pattern. Moderate atheromatous change about the arch and origin of the great vessels without hemodynamically significant stenosis. Right carotid system: Right CCA mildly tortuous but widely patent to the bifurcation without stenosis. Mild eccentric plaque at the origin of the right ICA without hemodynamically significant stenosis. Distal right ICA tortuous with mild atheromatous change without high-grade stenosis. Left carotid system: Left CCA mildly tortuous but widely patent to the bifurcation without stenosis. Scattered calcified plaque about the left bifurcation without hemodynamically significant stenosis. Left ICA widely patent distally to the skull base without stenosis, dissection or occlusion. Vertebral arteries: Both vertebral arteries arise from the subclavian arteries. Short-segment 30% stenosis at the origin of the right subclavian artery from the right  brachiocephalic artery (series 7, image 285). No other proximal subclavian stenosis. Vertebral arteries mildly tortuous proximally but are widely patent to the skull base without stenosis, dissection or occlusion. Skeleton: No acute osseous abnormality. No discrete or worrisome osseous lesions. Other neck: No other acute soft tissue abnormality within the neck. No mass lesion or adenopathy. Upper chest: No abnormality identified within the visualized upper chest. Review of the MIP images confirms the above findings CTA HEAD FINDINGS Anterior circulation: Petrous segments patent bilaterally. Scattered atheromatous plaque within the cavernous/supraclinoid ICAs with no more than mild multifocal stenosis. A1 segments widely patent. Normal anterior communicating artery complex. Anterior cerebral arteries widely patent to their distal aspects without stenosis. No M1 stenosis or occlusion. There is a focal inferior division, consistent with a small aneurysm (series 8, image 91). This is directed laterally. Left MCA bifurcation within normal limits. Distal MCA branches well perfused and symmetric. Posterior circulation: Vertebral arteries widely patent to the vertebrobasilar junction without stenosis. Left vertebral artery dominant. Both picas are patent. Basilar widely patent to its distal aspect without stenosis. Superior cerebral arteries patent bilaterally. Both PCAs well perfused to their distal aspects without stenosis. Venous sinuses: Grossly patent allowing for timing the contrast bolus. Left transverse and sigmoid sinuses are markedly hypoplastic. Anatomic variants: None significant. Review of the MIP images confirms the above findings IMPRESSION: 1. Negative CTA for emergent large vessel occlusion. No high-grade or correctable stenosis identified. No other acute vascular abnormality. 2. Mild to moderate atheromatous change about the aortic arch, carotid bifurcations, and  carotid siphons without hemodynamically  significant or correctable stenosis. 3. 2 mm right MCA bifurcation aneurysm as above. Electronically Signed   By: Rise Mu M.D.   On: 11/22/2019 02:54   ECHOCARDIOGRAM COMPLETE  Result Date: 11/22/2019    ECHOCARDIOGRAM REPORT   Patient Name:   GENELDA ROARK Date of Exam: 11/22/2019 Medical Rec #:  [id number]      Height:       62.0 in Accession #:    [id number]     Weight:       164.0 lb Date of Birth:  [birth year]      BSA:          1.757 m Patient Age:    53 years       BP:           158/93 mmHg Patient Gender: F              HR:           75 bpm. Exam Location:  Inpatient Procedure: 2D Echo, Cardiac Doppler and Color Doppler Indications:    I47.2 Ventricular hypertrophy.  History:        Patient has no prior history of Echocardiogram examinations.                 Risk Factors:Hypertension and Former Smoker. History of cocaine                 abuse.  Sonographer:    Sheralyn Boatman RDCS Referring Phys: [id number] CARINA M BROWN IMPRESSIONS  1. Normal LV systolic function; severe LVH; grade 1 diastolic dysfunction; trace AI.  2. Left ventricular ejection fraction, by estimation, is 60 to 65%. The left ventricle has normal function. The left ventricle has no regional wall motion abnormalities. There is severe left ventricular hypertrophy. Left ventricular diastolic parameters  are consistent with Grade I diastolic dysfunction (impaired relaxation).  3. Right ventricular systolic function is normal. The right ventricular size is normal.  4. The mitral valve is normal in structure. Trivial mitral valve regurgitation. No evidence of mitral stenosis.  5. The aortic valve is tricuspid. Aortic valve regurgitation is trivial. No aortic stenosis is present.  6. The inferior vena cava is normal in size with greater than 50% respiratory variability, suggesting right atrial pressure of 3 mmHg. FINDINGS  Left Ventricle: Left ventricular ejection fraction, by estimation, is 60 to 65%. The left ventricle has normal  function. The left ventricle has no regional wall motion abnormalities. The left ventricular internal cavity size was normal in size. There is  severe left ventricular hypertrophy. Left ventricular diastolic parameters are consistent with Grade I diastolic dysfunction (impaired relaxation). Right Ventricle: The right ventricular size is normal. Right ventricular systolic function is normal. Left Atrium: Left atrial size was normal in size. Right Atrium: Right atrial size was normal in size. Pericardium: There is no evidence of pericardial effusion. Mitral Valve: The mitral valve is normal in structure. Normal mobility of the mitral valve leaflets. Mild mitral annular calcification. Trivial mitral valve regurgitation. No evidence of mitral valve stenosis. Tricuspid Valve: The tricuspid valve is normal in structure. Tricuspid valve regurgitation is trivial. No evidence of tricuspid stenosis. Aortic Valve: The aortic valve is tricuspid. Aortic valve regurgitation is trivial. No aortic stenosis is present. Pulmonic Valve: The pulmonic valve was normal in structure. Pulmonic valve regurgitation is not visualized. No evidence of pulmonic stenosis. Aorta: The aortic root is normal in size and structure. Venous: The inferior vena cava is  normal in size with greater than 50% respiratory variability, suggesting right atrial pressure of 3 mmHg. IAS/Shunts: No atrial level shunt detected by color flow Doppler. Additional Comments: Normal LV systolic function; severe LVH; grade 1 diastolic dysfunction; trace AI.  LEFT VENTRICLE PLAX 2D LVIDd:         3.50 cm     Diastology LVIDs:         2.20 cm     LV e' lateral:   9.03 cm/s LV PW:         1.70 cm     LV E/e' lateral: 5.1 LV IVS:        1.60 cm     LV e' medial:    5.33 cm/s LVOT diam:     2.00 cm     LV E/e' medial:  8.7 LV SV:         59 LV SV Index:   34 LVOT Area:     3.14 cm  LV Volumes (MOD) LV vol d, MOD A2C: 84.8 ml LV vol d, MOD A4C: 81.1 ml LV vol s, MOD A2C: 34.4 ml  LV vol s, MOD A4C: 33.6 ml LV SV MOD A2C:     50.4 ml LV SV MOD A4C:     81.1 ml LV SV MOD BP:      51.3 ml RIGHT VENTRICLE             IVC RV S prime:     12.60 cm/s  IVC diam: 1.40 cm TAPSE (M-mode): 2.0 cm LEFT ATRIUM             Index       RIGHT ATRIUM           Index LA diam:        2.90 cm 1.65 cm/m  RA Area:     11.80 cm LA Vol (A2C):   36.6 ml 20.83 ml/m RA Volume:   28.60 ml  16.28 ml/m LA Vol (A4C):   36.0 ml 20.49 ml/m LA Biplane Vol: 36.7 ml 20.89 ml/m  AORTIC VALVE LVOT Vmax:   109.00 cm/s LVOT Vmean:  72.700 cm/s LVOT VTI:    0.188 m  AORTA Ao Root diam: 2.60 cm Ao Asc diam:  3.20 cm MITRAL VALVE MV Area (PHT): 3.42 cm    SHUNTS MV Decel Time: 222 msec    Systemic VTI:  0.19 m MV E velocity: 46.50 cm/s  Systemic Diam: 2.00 cm MV A velocity: 60.30 cm/s MV E/A ratio:  0.77 Olga MillersBrian Crenshaw MD Electronically signed by Olga MillersBrian Crenshaw MD Signature Date/Time: 11/22/2019/7:46:47 PM    Final      Discharge Medications:  Allergies as of 11/23/2019      Reactions   Bupropion Other (See Comments)   Pt said it made her unsympathetic      Medication List    STOP taking these medications   lisinopril-hydrochlorothiazide 20-25 MG tablet Commonly known as: ZESTORETIC   naproxen 500 MG tablet Commonly known as: NAPROSYN     TAKE these medications   acetaminophen 325 MG tablet Commonly known as: TYLENOL Take 650 mg by mouth 2 (two) times daily as needed for mild pain.   amLODipine 10 MG tablet Commonly known as: NORVASC Take 1 tablet (10 mg total) by mouth daily. Start taking on: November 24, 2019 What changed:   medication strength  how much to take   aspirin EC 81 MG tablet Take 1 tablet (81 mg total) by mouth daily.  [facility] Nicotine Polacrilex 4 MG lozenge Generic drug: nicotine polacrilex TAKE 1 LOZENGE BY MOUTH AS NEEDED FOR SMOKING CESSATION   fluticasone 50 MCG/ACT nasal spray Commonly known as: FLONASE Place 2 sprays into both nostrils daily.   hydrochlorothiazide 25  MG tablet Commonly known as: HYDRODIURIL Take 1 tablet (25 mg total) by mouth daily. Start taking on: November 24, 2019   losartan 50 MG tablet Commonly known as: COZAAR Take 1 tablet (50 mg total) by mouth daily. Start taking on: November 24, 2019   mometasone 0.1 % ointment Commonly known as: ELOCON APPLY  OINTMENT TOPICALLY TO AFFECTED AREA ONCE DAILY What changed:   how much to take  how to take this  when to take this  reasons to take this  additional instructions   naproxen sodium 220 MG tablet Commonly known as: ALEVE Take 220 mg by mouth 2 (two) times daily as needed (Pain).   triamcinolone cream 0.1 % Commonly known as: KENALOG APPLY  CREAM EXTERNALLY TO AFFECTED AREA TWICE DAILY What changed: See the new instructions.       Discharge Instructions: Please refer to Patient Instructions section of EMR for full details.  Patient was counseled important signs and symptoms that should prompt return to medical care, changes in medications, dietary instructions, activity restrictions, and follow up appointments.   Follow-Up Appointments:  Follow-up Information    Lisbeth Renshaw, MD Follow up on 12/01/2019.   Specialty: Neurosurgery Why: 10:00 for follow up for Aneurysm. Please call the office one week ahead of the appt (no later than June 25th) to straighten out insurance! [phone] Contact information: [address] [place] [address] [phone]        Sandre Kitty, MD Follow up on 11/26/2019.   Specialty: Family Medicine Why: Your appointment is scheduled for 1:30 PM.  Please show up 15 minutes prior to your appointment time. Contact information: [address] [place] [address] [phone]               Jackelyn Poling, DO 11/23/2019, 1:43 PM PGY-1, [facility]

## 2019-11-23 NOTE — Progress Notes (Signed)
Received verbal order from MD not to provide labetalol unless SBP is > 180. Will continue to monitor.

## 2019-11-26 ENCOUNTER — Other Ambulatory Visit: Payer: Self-pay

## 2019-11-26 ENCOUNTER — Ambulatory Visit (INDEPENDENT_AMBULATORY_CARE_PROVIDER_SITE_OTHER): Payer: Self-pay | Admitting: Family Medicine

## 2019-11-26 VITALS — BP 152/86 | HR 76 | Ht 62.0 in | Wt 159.6 lb

## 2019-11-26 DIAGNOSIS — I10 Essential (primary) hypertension: Secondary | ICD-10-CM

## 2019-11-26 DIAGNOSIS — L309 Dermatitis, unspecified: Secondary | ICD-10-CM

## 2019-11-26 DIAGNOSIS — I729 Aneurysm of unspecified site: Secondary | ICD-10-CM

## 2019-11-26 MED ORDER — TRIAMCINOLONE ACETONIDE 0.1 % EX CREA: TOPICAL_CREAM | CUTANEOUS | 0 refills | Status: DC

## 2019-11-26 MED ORDER — MOMETASONE FUROATE 0.1 % EX OINT: TOPICAL_OINTMENT | CUTANEOUS | 1 refills | Status: DC

## 2019-11-26 NOTE — Assessment & Plan Note (Signed)
2 mm right MCA bifurcation aneurysm.  Seen on CT angio head on most recent hospitalization.  Patient has made appointment with neurosurgery for outpatient follow-up.

## 2019-11-26 NOTE — Assessment & Plan Note (Signed)
Patient compliant with her amlodipine, hydrochlorothiazide, losartan.  Blood pressure today was improved but still higher than what is optimal.  We will get BMP today and then follow-up in approximately 2 weeks.  At that time we may increase losartan or add another agent if still elevated.  Need to consider hypertension work-up in the future.

## 2019-11-26 NOTE — Progress Notes (Signed)
    SUBJECTIVE:   CHIEF COMPLAINT / HPI:   Hypertensive emergency f/u: Patient is taking losartan, amlodipine, hydrochlorothiazide.  She was feeling some dizziness with these medications but last night switched to taking them at night before bed and this morning she has not had any symptoms.  She is going to start working again.  She is a Lawyer.Candice Evans    MCA aneurysm: Patient made a neurosurgery follow-up on the 30th of this month but will not be able to make it because her grandson is having an operation she needs to be there.  She says she is already contacted them to try and reschedule the appointment but has not heard back.  PERTINENT  PMH / PSH: Hypertension  OBJECTIVE:   BP (!) 152/86   Pulse 76   Ht 5\' 2"  (1.575 m)   Wt 159 lb 9.6 oz (72.4 kg)   LMP  (LMP Unknown)   SpO2 97%   BMI 29.19 kg/m   General: Alert, oriented.  No acute distress. CV: Regular rate and rhythm.  No murmurs. Pulmonary: Lungs clear to auscultation bilaterally, no wheezes or crackles. Extremities: No pedal edema  ASSESSMENT/PLAN:   HYPERTENSION, BENIGN SYSTEMIC Patient compliant with her amlodipine, hydrochlorothiazide, losartan.  Blood pressure today was improved but still higher than what is optimal.  We will get BMP today and then follow-up in approximately 2 weeks.  At that time we may increase losartan or add another agent if still elevated.  Need to consider hypertension work-up in the future.  Aneurysm (HCC) 2 mm right MCA bifurcation aneurysm.  Seen on CT angio head on most recent hospitalization.  Patient has made appointment with neurosurgery for outpatient follow-up.      , MD [facility]

## 2019-11-26 NOTE — Patient Instructions (Addendum)
I would like to see you back in 1-2 weeks to discuss further treatment of your blood pressure.  We will get some blood tests today. If anything is abnormal I will call you to let you know if we need to make any changes to your medications.    Please call the neurosurgery office to let them know you need to reschedule.    Frederic Jericho, MD

## 2019-11-27 LAB — BASIC METABOLIC PANEL
BUN/Creatinine Ratio: 35 — ABNORMAL HIGH (ref 9–23)
BUN: 25 mg/dL — ABNORMAL HIGH (ref 6–24)
CO2: 23 mmol/L (ref 20–29)
Calcium: 9.1 mg/dL (ref 8.7–10.2)
Chloride: 102 mmol/L (ref 96–106)
Creatinine, Ser: 0.72 mg/dL (ref 0.57–1.00)
GFR calc Af Amer: 109 mL/min/{1.73_m2} (ref >59)
GFR calc non Af Amer: 95 mL/min/{1.73_m2} (ref >59)
Glucose: 85 mg/dL (ref 65–99)
Potassium: 4.3 mmol/L (ref 3.5–5.2)
Sodium: 138 mmol/L (ref 134–144)

## 2019-12-07 NOTE — Progress Notes (Signed)
No show

## 2019-12-08 ENCOUNTER — Ambulatory Visit (INDEPENDENT_AMBULATORY_CARE_PROVIDER_SITE_OTHER): Payer: Self-pay | Admitting: Family Medicine

## 2019-12-08 DIAGNOSIS — Z5329 Procedure and treatment not carried out because of patient's decision for other reasons: Secondary | ICD-10-CM

## 2019-12-20 ENCOUNTER — Other Ambulatory Visit: Payer: Self-pay

## 2019-12-20 MED ORDER — LOSARTAN POTASSIUM 50 MG PO TABS: 50.0000 mg | ORAL_TABLET | Freq: Every day | ORAL | 0 refills | Status: DC

## 2019-12-20 MED ORDER — HYDROCHLOROTHIAZIDE 25 MG PO TABS: 25.0000 mg | ORAL_TABLET | Freq: Every day | ORAL | 0 refills | Status: DC

## 2019-12-20 NOTE — Telephone Encounter (Signed)
Patient calls nurse line requesting refills on Losartan and Hydrochlorothiazide. Patient reports she is out and needs these refilled ASAP. Per last OV note, patient was to FU in 1-2 weeks and she missed her apt. Patient scheduled with PCP for 7/28. Patient reports taking her blood pressure at home and has had 136/78 almost consistently. Please advise on refills before apt.

## 2019-12-20 NOTE — Telephone Encounter (Signed)
Will send in Rx for another 30 days.

## 2019-12-29 ENCOUNTER — Ambulatory Visit (INDEPENDENT_AMBULATORY_CARE_PROVIDER_SITE_OTHER): Payer: Self-pay | Admitting: Family Medicine

## 2019-12-29 ENCOUNTER — Other Ambulatory Visit: Payer: Self-pay

## 2019-12-29 ENCOUNTER — Encounter: Payer: Self-pay | Admitting: Family Medicine

## 2019-12-29 DIAGNOSIS — I729 Aneurysm of unspecified site: Secondary | ICD-10-CM

## 2019-12-29 DIAGNOSIS — K047 Periapical abscess without sinus: Secondary | ICD-10-CM

## 2019-12-29 DIAGNOSIS — I10 Essential (primary) hypertension: Secondary | ICD-10-CM

## 2019-12-29 MED ORDER — AMOXICILLIN-POT CLAVULANATE 875-125 MG PO TABS
1.0000 | ORAL_TABLET | Freq: Two times a day (BID) | ORAL | 0 refills | Status: AC
Start: 2019-12-29 — End: 2020-01-03

## 2019-12-29 NOTE — Patient Instructions (Addendum)
It was nice to see you today,   I have provided you with the orange card financial assistance paperwork.  Fill this out and return it at your convenience.  On your way out today, please tell them that you would like to schedule an appointment with Dr. Raymondo Band for 24-hour blood pressure monitoring.  This requires 2 visits on 2 consecutive days, 1 to get the device in 1 to return it.  Until that time I would like you to continue taking your medications at night.  You can also take your blood pressure first thing in the morning and first thing at night and record those before your appointment with Dr. Raymondo Band.  I have prescribed you an antibiotic to take twice a day for the next 5 days.  Hopefully they will be able to pull your tooth after the swelling goes down.  Have a great day,  Frederic Jericho, MD

## 2019-12-29 NOTE — Progress Notes (Signed)
    SUBJECTIVE:   CHIEF COMPLAINT / HPI:   HTN: Patient was taking her blood pressure medications in the morning, but switched to taking them at night.  She made this change because she felt like she was getting dizzy and sleepy during the day.  The symptoms improved after she started taking it at night, but she is concerned that her blood pressure during the day is higher now that she is taking it at night.  She checks her blood pressure around noon each day.   MCA aneurysm: Patient had to reschedule her appointment because she currently does not have insurance.  Abscess in tooth: Patient has a tooth that is broken off at the gumline.  She recently went to a Careers information officer" who would pull the tooth for $75, but they told her that it was too swollen and she would need antibiotics from her doctor for an infection before they would be able to remove it.  HM: Td, pap, mammo, colonoscopy, covid   PERTINENT  PMH / PSH: Hypertension, aneurysm  OBJECTIVE:   BP (!) 152/74   Pulse 79   Wt 159 lb 3.2 oz (72.2 kg)   LMP  (LMP Unknown)   SpO2 99%   BMI 29.12 kg/m   General: Alert and oriented.  No acute distress. HEENT: In general, patient has poor dentition with false teeth on the upper right side.  On the upper left-sided peers one of her molars is broken off at the gumline with some swelling on the gums anteriorly.  It is tender to palpation.  Does not exhibit any pus. CV: Regular rate and rhythm, no murmurs. Pulmonary: Lungs clear to auscultation bilaterally, no wheezes. Neuro: Cranial nerves II through XII grossly intact.  ASSESSMENT/PLAN:   HYPERTENSION, BENIGN SYSTEMIC Advised patient to continue taking her blood pressure as prescribed at night.  Advised patient to schedule an appointment with Dr. Shela Nevin for 24-hour blood pressure monitoring so that we can assess fluctuations in her blood pressure throughout the day.  Aneurysm [facility]) Patient has not gone to see her specialist because  of insurance issues.  Have provided the orange card packet to the patient to fill out and return.  Dental abscess We will give the patient antibiotics although I am not convinced there is an actual infection.  Hopefully the addition of the antibiotics will decrease swelling and allow the dentist to remove her remaining tooth.     Sandre Kitty, MD [facility]

## 2019-12-30 ENCOUNTER — Ambulatory Visit: Payer: Self-pay | Admitting: Pharmacist

## 2020-01-02 NOTE — Assessment & Plan Note (Signed)
We will give the patient antibiotics although I am not convinced there is an actual infection.  Hopefully the addition of the antibiotics will decrease swelling and allow the dentist to remove her remaining tooth.

## 2020-01-02 NOTE — Assessment & Plan Note (Signed)
Advised patient to continue taking her blood pressure as prescribed at night.  Advised patient to schedule an appointment with Dr. Shela Nevin for 24-hour blood pressure monitoring so that we can assess fluctuations in her blood pressure throughout the day.

## 2020-01-02 NOTE — Assessment & Plan Note (Signed)
Patient has not gone to see her specialist because of insurance issues.  Have provided the orange card packet to the patient to fill out and return.

## 2020-01-24 ENCOUNTER — Other Ambulatory Visit: Payer: Self-pay

## 2020-01-25 MED ORDER — HYDROCHLOROTHIAZIDE 25 MG PO TABS: 25.0000 mg | ORAL_TABLET | Freq: Every day | ORAL | 0 refills | Status: DC

## 2020-01-25 MED ORDER — AMLODIPINE BESYLATE 10 MG PO TABS: 10.0000 mg | ORAL_TABLET | Freq: Every day | ORAL | 0 refills | Status: DC

## 2020-01-25 MED ORDER — LOSARTAN POTASSIUM 50 MG PO TABS: 50.0000 mg | ORAL_TABLET | Freq: Every day | ORAL | 0 refills | Status: DC

## 2020-01-26 ENCOUNTER — Ambulatory Visit: Payer: Self-pay | Admitting: Family Medicine

## 2020-04-25 ENCOUNTER — Other Ambulatory Visit: Payer: Self-pay | Admitting: Family Medicine

## 2021-02-06 ENCOUNTER — Other Ambulatory Visit: Payer: Self-pay | Admitting: Family Medicine

## 2021-07-11 ENCOUNTER — Telehealth: Payer: Self-pay | Admitting: *Deleted

## 2021-07-11 ENCOUNTER — Other Ambulatory Visit: Payer: Self-pay | Admitting: *Deleted

## 2021-07-11 DIAGNOSIS — L309 Dermatitis, unspecified: Secondary | ICD-10-CM

## 2021-07-11 NOTE — Telephone Encounter (Signed)
LVM for pt to call office back. We have received several refill request for pt but they have not been seen since 12/29/2019.  Please assist them in getting an appointment scheduled. Will send request to PCP and inform him we are trying to get pt to schedule an appointment.  Not sure if they will fill without an appointment.Bethanee Redondo Zimmerman Rumple, CMA

## 2021-07-12 NOTE — Telephone Encounter (Signed)
Patient returns call to nurse line. Patient states that she can only come in for AM appointments. PCP does not have any AM availability for Feb or March.   Scheduled patient with Dr. Melissa Noon for BP follow up on 2/15.  Veronda Prude, RN

## 2021-07-17 ENCOUNTER — Other Ambulatory Visit: Payer: Self-pay

## 2021-07-18 ENCOUNTER — Other Ambulatory Visit: Payer: Self-pay

## 2021-07-18 ENCOUNTER — Ambulatory Visit (INDEPENDENT_AMBULATORY_CARE_PROVIDER_SITE_OTHER): Payer: BC Managed Care – PPO | Admitting: Student

## 2021-07-18 ENCOUNTER — Encounter: Payer: Self-pay | Admitting: Student

## 2021-07-18 VITALS — BP 219/93 | HR 61 | Ht 62.0 in | Wt 150.4 lb

## 2021-07-18 DIAGNOSIS — L309 Dermatitis, unspecified: Secondary | ICD-10-CM

## 2021-07-18 DIAGNOSIS — E0590 Thyrotoxicosis, unspecified without thyrotoxic crisis or storm: Secondary | ICD-10-CM | POA: Diagnosis not present

## 2021-07-18 DIAGNOSIS — Z0000 Encounter for general adult medical examination without abnormal findings: Secondary | ICD-10-CM

## 2021-07-18 DIAGNOSIS — I729 Aneurysm of unspecified site: Secondary | ICD-10-CM | POA: Diagnosis not present

## 2021-07-18 DIAGNOSIS — I10 Essential (primary) hypertension: Secondary | ICD-10-CM | POA: Diagnosis not present

## 2021-07-18 LAB — POCT GLYCOSYLATED HEMOGLOBIN (HGB A1C): Hemoglobin A1C: 5.7 % — AB (ref 4.0–5.6)

## 2021-07-18 MED ORDER — MOMETASONE FUROATE 0.1 % EX OINT: TOPICAL_OINTMENT | CUTANEOUS | 1 refills | Status: DC

## 2021-07-18 MED ORDER — AMLODIPINE BESYLATE 10 MG PO TABS: 10.0000 mg | ORAL_TABLET | Freq: Every day | ORAL | 2 refills | Status: DC

## 2021-07-18 MED ORDER — TRIAMCINOLONE ACETONIDE 0.1 % EX CREA: TOPICAL_CREAM | CUTANEOUS | 0 refills | Status: DC

## 2021-07-18 NOTE — Telephone Encounter (Signed)
Patient calls nurse line regarding refill for mometasone ointment. Patient was just seen in clinic this morning with Dr. Melissa Noon. Patient states that she thought this was being refilled at today's visit.   Will forward to Dr. Melissa Noon for further clarification.   Veronda Prude, RN

## 2021-07-18 NOTE — Assessment & Plan Note (Signed)
BP significantly elevated today to 219/93. No abnormalities on physical exam and no concern for hypertensive emergency at this time. Labs ordered today- BMP, lipid panel, A1c. Prescribed Amlodipine 10mg  daily. Advised patient to schedule office visit in 1 week to re-check BP and titrate medication as appropriate. Given possibility of fluid retention with Amlodipine, can add HCTZ at next visit if needing further blood pressure control. Educated patient on importance of blood pressure control, especially in light of aneurysm. Provided with strict return precautions, which she understood. Advised her to bring blood pressure journal log to next visit in 1 week.

## 2021-07-18 NOTE — Patient Instructions (Addendum)
It was great to see you! Thank you for allowing me to participate in your care!  I recommend that you always bring your medications to each appointment as this makes it easy to ensure we are on the correct medications and helps [place] not miss when refills are needed.  As we discussed, For your high blood pressure: - Please follow-up in 1 week for an office visit when we will re-check your blood pressure and adjust your medications as needed, as well as go over your labs. - Take your amlodipine daily for high blood pressure. Your blood pressure goal is 110/60-140/90. - Check blood pressure daily for the next week and keep a log. Bring this log to your office visit.  For your aneurysm: - I have ordered a CT angiogram (head imaging). Please get this completed.  I  have also sent a referral to the imaging center for a mammogram. Please schedule this.  If you develop chest pain, shortness of breath, dizziness, or severe headache, please seek urgent medical care.  Take care and seek immediate care sooner if you develop any concerns.   Dr. Darral Dash [facility]

## 2021-07-18 NOTE — Progress Notes (Signed)
° ° °  SUBJECTIVE:   CHIEF COMPLAINT / HPI:   Candice Evans is a 56 year-old female here for re-establishing care to get refills on anti-hypertensive medications. She was last seen in our office on 12/29/2019.  Hypertension BP today in office is significantly elevated to 219/93 which she says is usual for her. Currently taking no medications. No headaches, dizziness, blurred or double vision. Has been having ocular migraines for years, happening more frequently. No pain associated with the aura. Does not check blood pressure at home.  Says she never took her prescribed Losartan and HCTZ because they made her dizzy, but she did take Amlodipine.   MCA Aneurysm One year ago, had a headache and was evaluated in ER where she was found to have a 12mm right bifurcation aneurysm seen on CT angio head/neck. Was not able to see neurosurgery due to insurance issues.  Tobacco use: Quit smoking cigarettes a few years ago. Praised patient for this.  HM: Declined flu vaccine (says she had prior bad reaction with sinus infection), mammogram  PERTINENT  PMH / PSH: Hypertension, MCA aneurysm  OBJECTIVE:   BP (!) 219/93    Pulse 61    Ht 5\' 2"  (1.575 m)    Wt 150 lb 6 oz (68.2 kg)    LMP  (LMP Unknown)    SpO2 100%    BMI 27.50 kg/m   General: Well-appearing, in no distress, pleasant HEENT: Pupils PERRLA. EOMI. Poor dentition. MMM CV: Regular rate and rhythm, no murmurs appreciated Resp: Normal work of breathing on room air. Lungs clear in all fields to auscultation Abdomen: Soft, non-tender, non-distended Neuro: No focal deficits   ASSESSMENT/PLAN:   Aneurysm (HCC) Needs repeat CT angio head/neck to evaluate for increasing size. Ordered and scheduled today. Will f/u on this and place neurosurgery consult as appropriate.  HYPERTENSION, BENIGN SYSTEMIC BP significantly elevated today to 219/93. No abnormalities on physical exam and no concern for hypertensive emergency at this time. Labs ordered today-  BMP, lipid panel, A1c. Prescribed Amlodipine 10mg  daily. Advised patient to schedule office visit in 1 week to re-check BP and titrate medication as appropriate. Given possibility of fluid retention with Amlodipine, can add HCTZ at next visit if needing further blood pressure control. Educated patient on importance of blood pressure control, especially in light of aneurysm. Provided with strict return precautions, which she understood. Advised her to bring blood pressure journal log to next visit in 1 week.     05-09-2006, DO [facility]

## 2021-07-18 NOTE — Assessment & Plan Note (Signed)
Needs repeat CT angio head/neck to evaluate for increasing size. Ordered and scheduled today. Will f/u on this and place neurosurgery consult as appropriate.

## 2021-07-19 ENCOUNTER — Telehealth (INDEPENDENT_AMBULATORY_CARE_PROVIDER_SITE_OTHER): Payer: BC Managed Care – PPO | Admitting: Student

## 2021-07-19 LAB — LIPID PANEL
Chol/HDL Ratio: 2.6 ratio (ref 0.0–4.4)
Cholesterol, Total: 165 mg/dL (ref 100–199)
HDL: 64 mg/dL (ref >39)
LDL Chol Calc (NIH): 93 mg/dL (ref 0–99)
Triglycerides: 37 mg/dL (ref 0–149)
VLDL Cholesterol Cal: 8 mg/dL (ref 5–40)

## 2021-07-19 LAB — TSH: TSH: 1.44 u[IU]/mL (ref 0.450–4.500)

## 2021-07-19 LAB — BASIC METABOLIC PANEL
BUN/Creatinine Ratio: 22 (ref 9–23)
BUN: 13 mg/dL (ref 6–24)
CO2: 25 mmol/L (ref 20–29)
Calcium: 9.2 mg/dL (ref 8.7–10.2)
Chloride: 105 mmol/L (ref 96–106)
Creatinine, Ser: 0.59 mg/dL (ref 0.57–1.00)
Glucose: 126 mg/dL — ABNORMAL HIGH (ref 70–99)
Potassium: 4.2 mmol/L (ref 3.5–5.2)
Sodium: 142 mmol/L (ref 134–144)
eGFR: 105 mL/min/{1.73_m2} (ref >59)

## 2021-07-19 NOTE — Telephone Encounter (Signed)
Erroneous encounter

## 2021-07-25 ENCOUNTER — Ambulatory Visit (HOSPITAL_COMMUNITY): Payer: BC Managed Care – PPO

## 2021-09-04 ENCOUNTER — Other Ambulatory Visit: Payer: Self-pay | Admitting: Student

## 2021-09-04 DIAGNOSIS — Z0000 Encounter for general adult medical examination without abnormal findings: Secondary | ICD-10-CM

## 2021-09-14 ENCOUNTER — Ambulatory Visit: Payer: BC Managed Care – PPO

## 2021-10-16 ENCOUNTER — Other Ambulatory Visit: Payer: Self-pay

## 2021-10-16 MED ORDER — AMLODIPINE BESYLATE 10 MG PO TABS
10.0000 mg | ORAL_TABLET | Freq: Every day | ORAL | 2 refills | Status: DC
Start: 2021-10-16 — End: 2022-01-21

## 2021-10-19 ENCOUNTER — Encounter: Payer: Self-pay | Admitting: Student

## 2021-10-19 ENCOUNTER — Ambulatory Visit (INDEPENDENT_AMBULATORY_CARE_PROVIDER_SITE_OTHER): Payer: BC Managed Care – PPO | Admitting: Student

## 2021-10-19 VITALS — BP 170/72 | HR 63 | Ht 62.0 in | Wt 151.4 lb

## 2021-10-19 DIAGNOSIS — I729 Aneurysm of unspecified site: Secondary | ICD-10-CM

## 2021-10-19 DIAGNOSIS — Z716 Tobacco abuse counseling: Secondary | ICD-10-CM | POA: Diagnosis not present

## 2021-10-19 DIAGNOSIS — I10 Essential (primary) hypertension: Secondary | ICD-10-CM | POA: Diagnosis not present

## 2021-10-19 DIAGNOSIS — Z0000 Encounter for general adult medical examination without abnormal findings: Secondary | ICD-10-CM

## 2021-10-19 MED ORDER — LOSARTAN POTASSIUM 50 MG PO TABS: 50.0000 mg | ORAL_TABLET | Freq: Every day | ORAL | 3 refills | Status: DC

## 2021-10-19 NOTE — Assessment & Plan Note (Signed)
BP 170/72.  Better controlled with amlodipine 10 mg daily.  Added losartan 50 mg daily.  ASCVD risk score below, recommend initiation of moderate intensity statin.  Will discuss at follow-up in 1 month.  The 10-year ASCVD risk score (Arnett DK, et al., 2019) is: 10.3%   Values used to calculate the score:     Age: 56 years     Sex: Female     Is Non-Hispanic African American: Yes     Diabetic: No     Tobacco smoker: No     Systolic Blood Pressure: 123XX123 mmHg     Is BP treated: Yes     HDL Cholesterol: 64 mg/dL     Total Cholesterol: 165 mg/dL

## 2021-10-19 NOTE — Assessment & Plan Note (Signed)
Advised returning for annual wellness exam for discussion of mammogram, colonoscopy, Pap, vaccines.  Patient states she is too busy however, I advised patient that should she change her mind, would be happy to discuss.

## 2021-10-19 NOTE — Assessment & Plan Note (Signed)
Referred to neurosurgery given price difficulty for patient.  Will need CTA head for screening in the future

## 2021-10-19 NOTE — Progress Notes (Signed)
  SUBJECTIVE:   CHIEF COMPLAINT / HPI:   Hypertension: She has been taking her Amlodipine everyday. She states she used to get dizzy when she took HCTZ in the past. No chest pain, headaches,   She does note she gets ocular migraines about once a month which affects her vision (never the same side).   MCA aneurysm: Patient states the imaging was going to cost $4000 and she cannot afford that and will need help with just find that cause.  PERTINENT  PMH / PSH: HTN, MCA aneurysm  OBJECTIVE:  BP (!) 170/72   Pulse 63   Ht 5\' 2"  (1.575 m)   Wt 151 lb 6.4 oz (68.7 kg)   LMP  (LMP Unknown)   SpO2 100%   BMI 27.69 kg/m   General: NAD, pleasant, able to participate in exam Cardiac: RRR, no murmurs auscultated. Respiratory: CTAB, normal effort, no wheezes, rales or rhonchi  ASSESSMENT/PLAN:  Tobacco abuse counseling No longer smoking. Using nicotine tablets. Congratulated on quitting 5 years ago.   HYPERTENSION, BENIGN SYSTEMIC BP 170/72.  Better controlled with amlodipine 10 mg daily.  Added losartan 50 mg daily.  ASCVD risk score below, recommend initiation of moderate intensity statin.  Will discuss at follow-up in 1 month.  The 10-year ASCVD risk score (Arnett DK, et al., 2019) is: 10.3%   Values used to calculate the score:     Age: 56 years     Sex: Female     Is Non-Hispanic African American: Yes     Diabetic: No     Tobacco smoker: No     Systolic Blood Pressure: 170 mmHg     Is BP treated: Yes     HDL Cholesterol: 64 mg/dL     Total Cholesterol: 165 mg/dL   Aneurysm (HCC) Referred to neurosurgery given price difficulty for patient.  Will need CTA head for screening in the future  Healthcare maintenance Advised returning for annual wellness exam for discussion of mammogram, colonoscopy, Pap, vaccines.  Patient states she is too busy however, I advised patient that should she change her mind, would be happy to discuss.  Orders Placed This Encounter  Procedures    Ambulatory referral to Neurosurgery    Referral Priority:   Routine    Referral Type:   Surgical    Referral Reason:   Specialty Services Required    Requested Specialty:   Neurosurgery    Number of Visits Requested:   1   Meds ordered this encounter  Medications   losartan (COZAAR) 50 MG tablet    Sig: Take 1 tablet (50 mg total) by mouth at bedtime.    Dispense:  90 tablet    Refill:  3   Return in about 1 month (around 11/19/2021) for Blood pressure and statin discussion. 11/21/2021 Isidoro Donning) Alinda Money, DO 10/19/2021, 11:55 AM PGY-1, [facility]

## 2021-10-19 NOTE — Patient Instructions (Signed)
It was great to see you today! Thank you for choosing [facility] for your primary care. Candice Evans was seen for hypertension.  Today we addressed: Hypertension: Start losartan 50 mg daily and continue taking your amlodipine 10 mg daily.  Medical guidelines also recommend starting a medication for your cholesterol.  Although your cholesterol is not drastically high, in correlation with your age and high blood pressure it is recommended that we control this better than it currently is.  I would like to discuss this with you again in 1 month for me recheck to see how your blood pressure is going. Aneurysm: I referred you to neurosurgery as they may be able to assist with are going to your insurance that a cheaper price would be justified for getting screening for your aneurysm. Healthcare maintenance: Again congratulations on quitting smoking 5 years ago.  As discussed, I do recommend an annual wellness visit to address screening exams such as Pap smear, mammogram, colonoscopy, and updating her vaccines.   Orders Placed This Encounter  Procedures   Ambulatory referral to Neurosurgery    Referral Priority:   Routine    Referral Type:   Surgical    Referral Reason:   Specialty Services Required    Requested Specialty:   Neurosurgery    Number of Visits Requested:   1   Meds ordered this encounter  Medications   losartan (COZAAR) 50 MG tablet    Sig: Take 1 tablet (50 mg total) by mouth at bedtime.    Dispense:  90 tablet    Refill:  3    We are checking some labs today. If they are abnormal, I will call you. If they are normal, I will send you a MyChart message (if it is active) or a letter in the mail. If you do not hear about your labs in the next 2 weeks, please call the office.  You should return to our clinic Return in about 1 month (around 11/19/2021) for Blood pressure and statin discussion.  I recommend that you always bring your medications to each appointment as this  makes it easy to ensure you are on the correct medications and helps [place] not miss refills when you need them.  Please arrive 15 minutes before your appointment to ensure smooth check in process.  We appreciate your efforts in making this happen.  Take care and seek immediate care sooner if you develop any concerns.   Thank you for allowing me to participate in your care, Candice Evans) Candice Piedra, DO 10/19/2021, 9:25 AM PGY-1, [facility]

## 2021-10-19 NOTE — Assessment & Plan Note (Signed)
No longer smoking. Using nicotine tablets. Congratulated on quitting 5 years ago.

## 2021-11-06 ENCOUNTER — Encounter: Payer: Self-pay | Admitting: *Deleted

## 2022-01-21 ENCOUNTER — Other Ambulatory Visit: Payer: Self-pay | Admitting: Student

## 2022-02-18 IMAGING — CT CT HEAD W/O CM
4 series · 17 of 47 positions shown, 19 images · non-contrast
Comparison: July 04, 2017

CLINICAL DATA: Generalized malaise.

EXAM:
CT HEAD WITHOUT CONTRAST
TECHNIQUE: Contiguous axial images were obtained from the base of the skull
through the vertex without intravenous contrast.

[Series 3: head wo · axial · 0.49mm/px · z∈[-215,-65]mm · 7 of 40 slices shown, 9 images]
[im 5/40  brain]
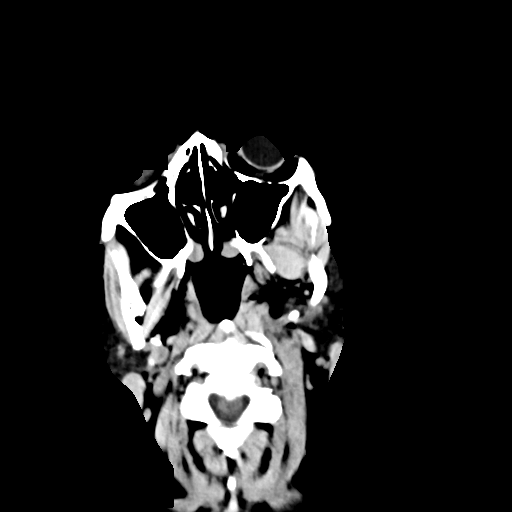
[im 5/40  bone]
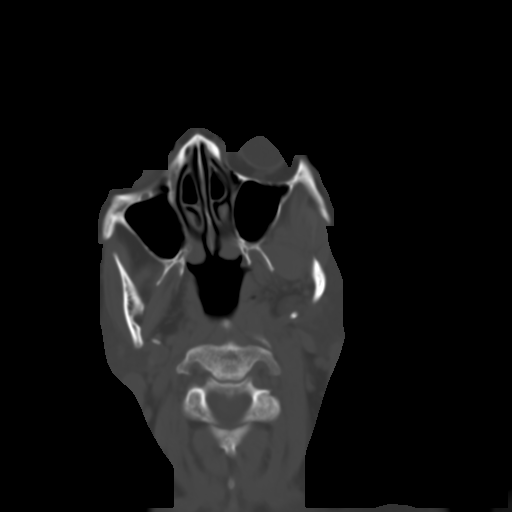
[im 10/40  brain]
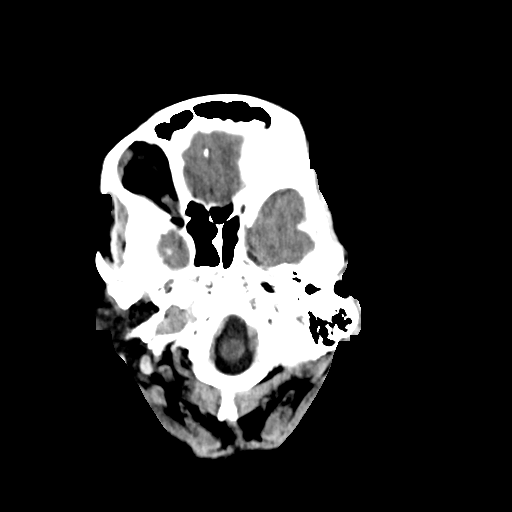
[im 15/40  brain]
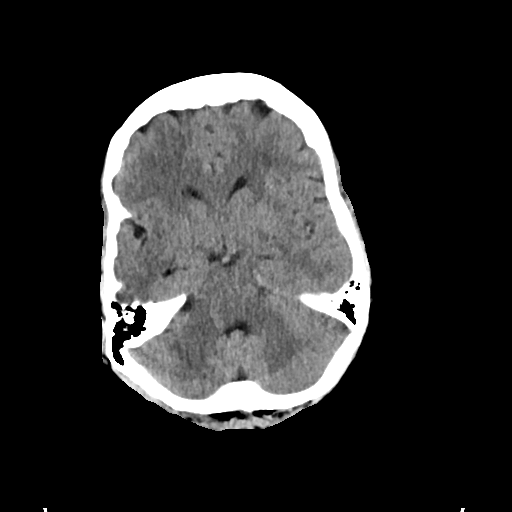
[im 20/40  brain]
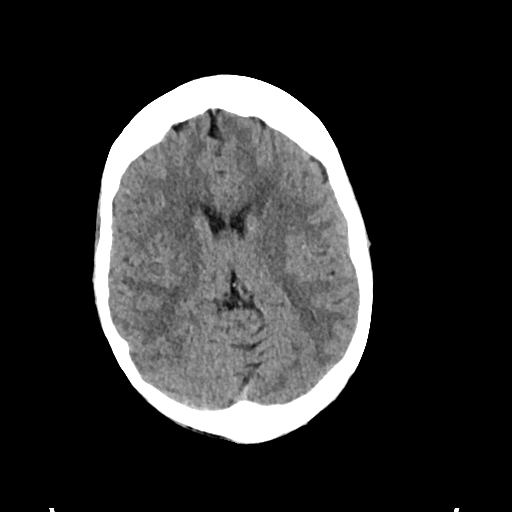
[im 25/40  brain]
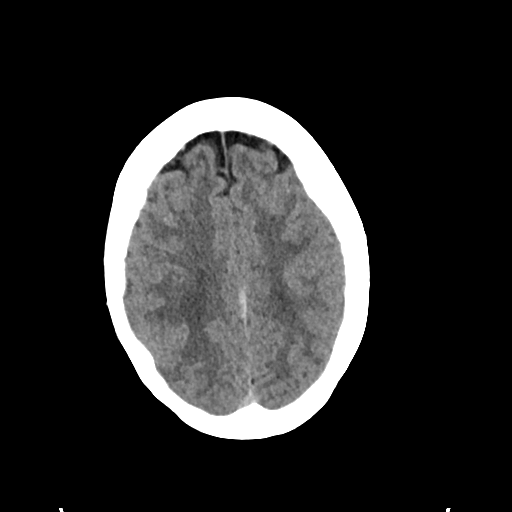
[im 25/40  bone]
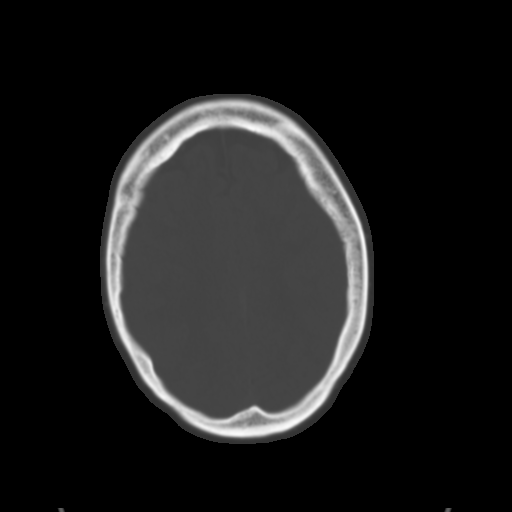
[im 30/40  brain]
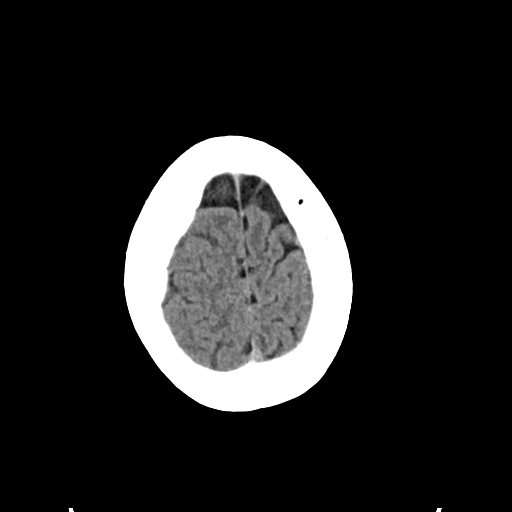
[im 35/40  brain]
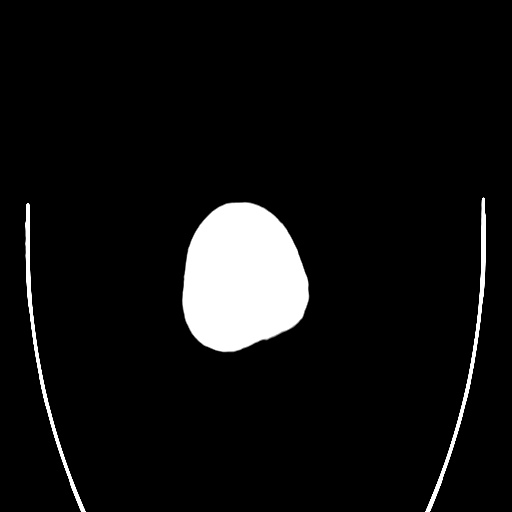

[Series 4: head bone · axial · 0.49mm/px · z∈[-217,-147]mm · 4 of 100 slices shown]
[im 10/100  bone]
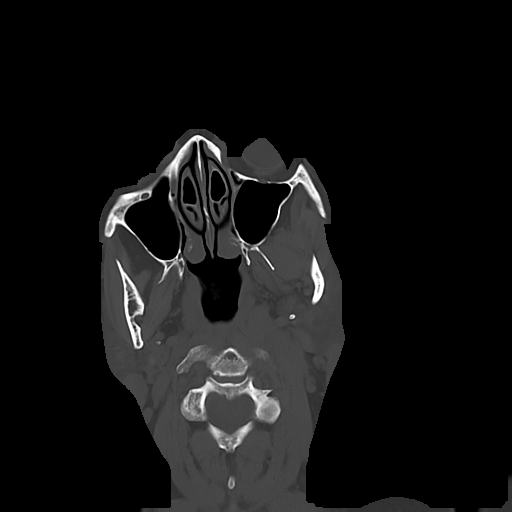
[im 20/100  bone]
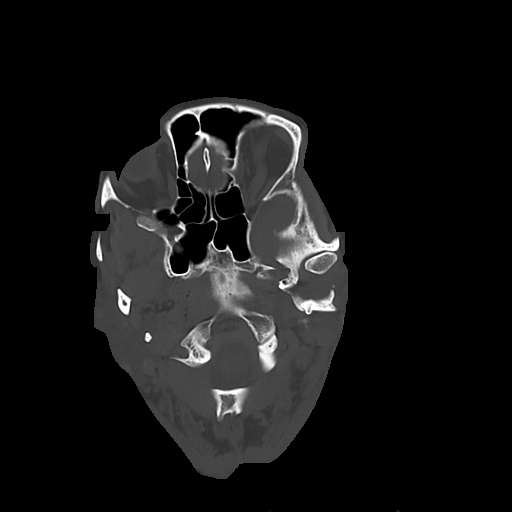
[im 30/100  bone]
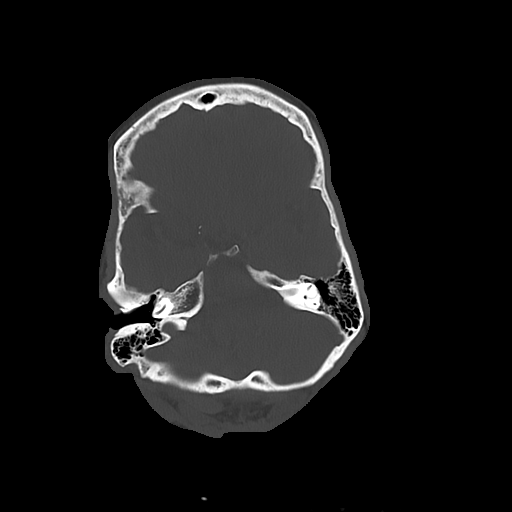
[im 45/100  bone]
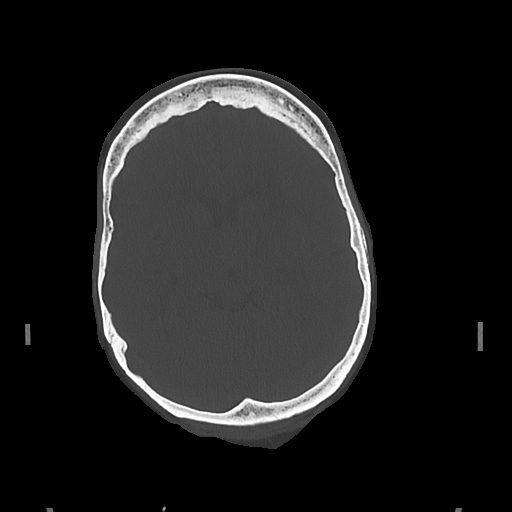

[Series 5: cor soft · coronal · 0.32mm/px · 3 of 66 slices shown]
[im 22/66  brain]
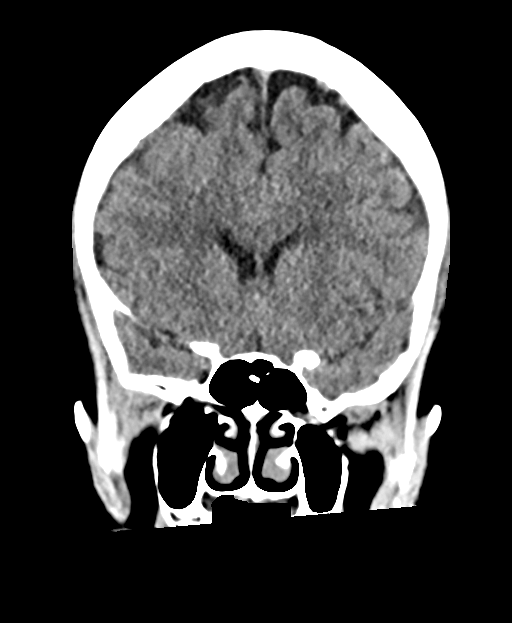
[im 29/66  brain]
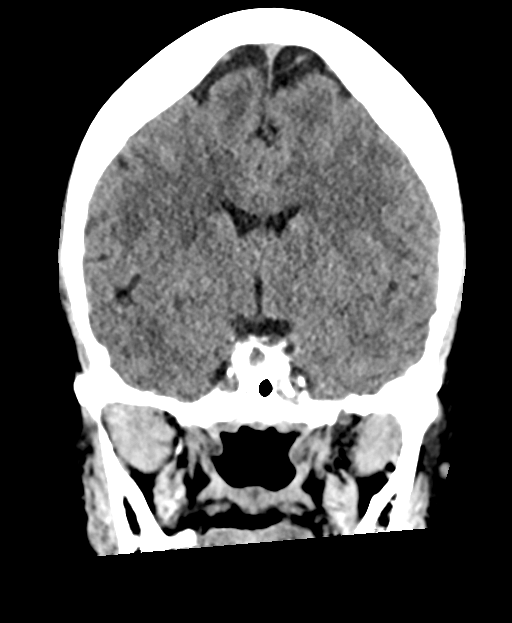
[im 37/66  brain]
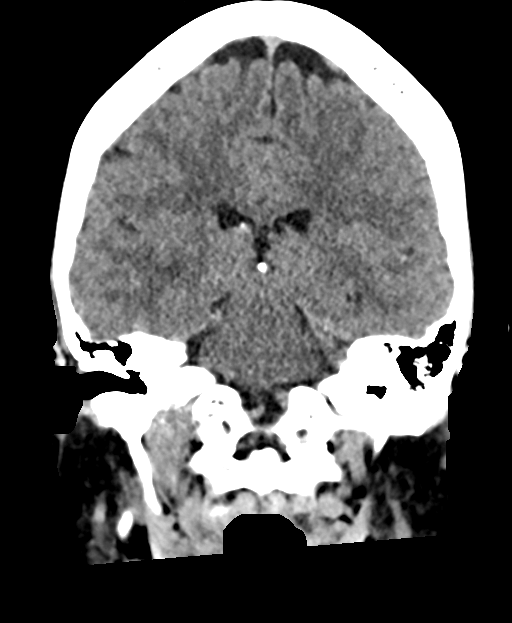

[Series 6: sag soft · sagittal · 0.39mm/px · 3 of 56 slices shown]
[im 19/56  brain]
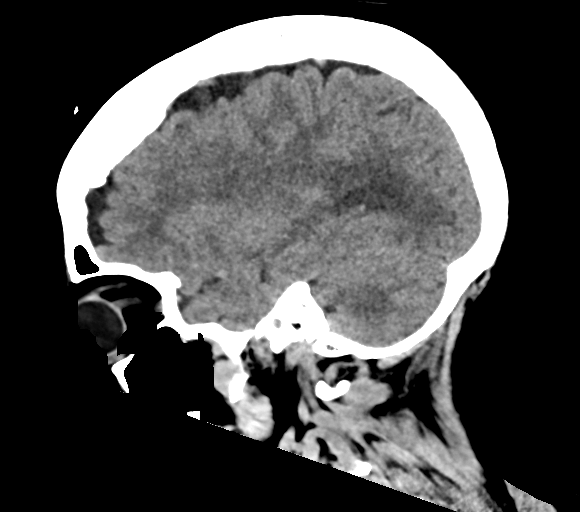
[im 28/56  brain]
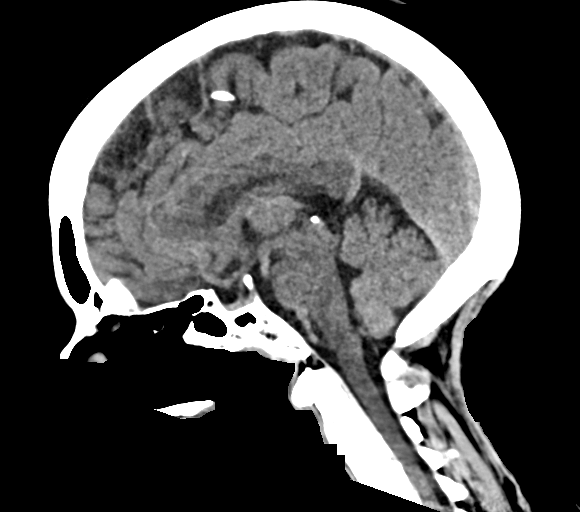
[im 37/56  brain]
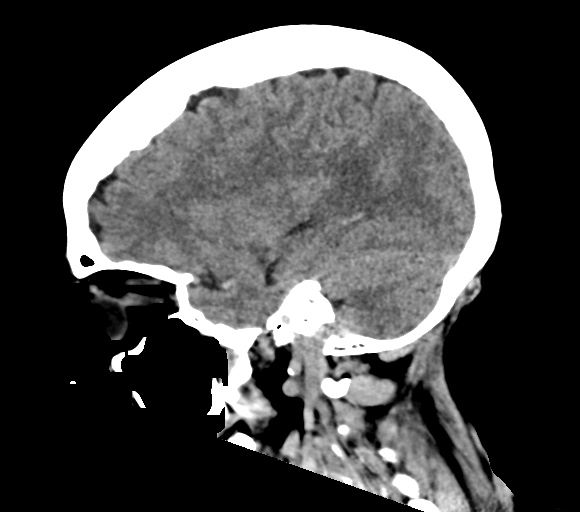

[17 of 47 positions shown; findings below may reference images not displayed]

FINDINGS: Brain: No evidence of acute infarction, hemorrhage, hydrocephalus,
extra-axial collection or mass lesion/mass effect.

Vascular: No hyperdense vessel or unexpected calcification.

Skull: Normal. Negative for fracture or focal lesion.

Sinuses/Orbits: No acute finding.

Other: None.
IMPRESSION: No acute intracranial pathology.

## 2022-02-19 IMAGING — CT CT ANGIO HEAD
2 of 7 series · 8 of 33 positions shown · IV contrast (OMNI 350)
Comparison: Prior head CT from earlier the same day.

CLINICAL DATA: Initial evaluation for acute headache.

EXAM:
CT ANGIOGRAPHY HEAD AND NECK
TECHNIQUE: Multidetector CT imaging of the head and neck was performed using
the standard protocol during bolus administration of intravenous
contrast. Multiplanar CT image reconstructions and MIPs were
obtained to evaluate the vascular anatomy. Carotid stenosis
measurements (when applicable) are obtained utilizing NASCET
criteria, using the distal internal carotid diameter as the
denominator.
CONTRAST:  100mL OMNIPAQUE IOHEXOL 350 MG/ML SOLN

[Series 5: cta neck · axial · 0.44mm/px · z∈[-204,-86]mm · 2 of 177 slices shown]
[im 59/177  soft-tissue]
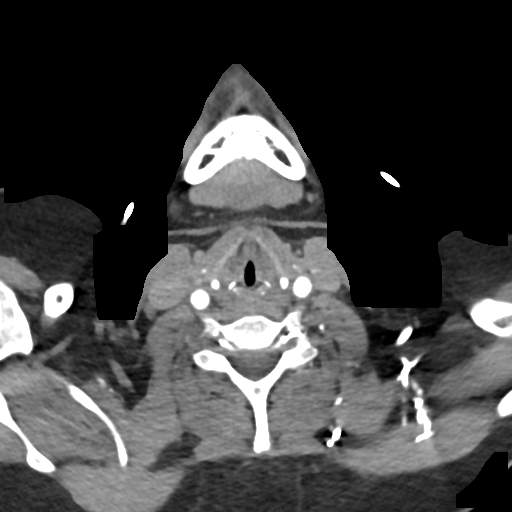
[im 118/177  soft-tissue]
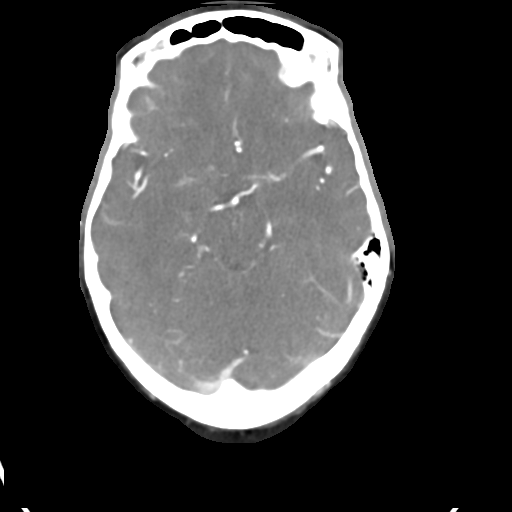

[Series 7: cta neck axial · axial · 0.39mm/px · z∈[-268,-18]mm · 6 of 353 slices shown]
[im 51/353  soft-tissue]
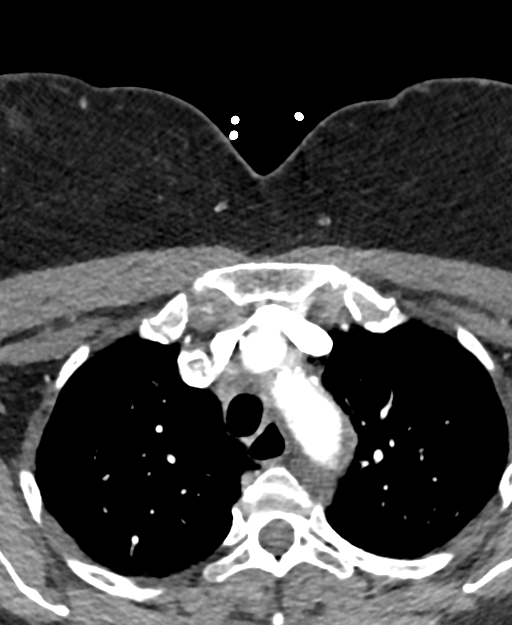
[im 101/353  bone]
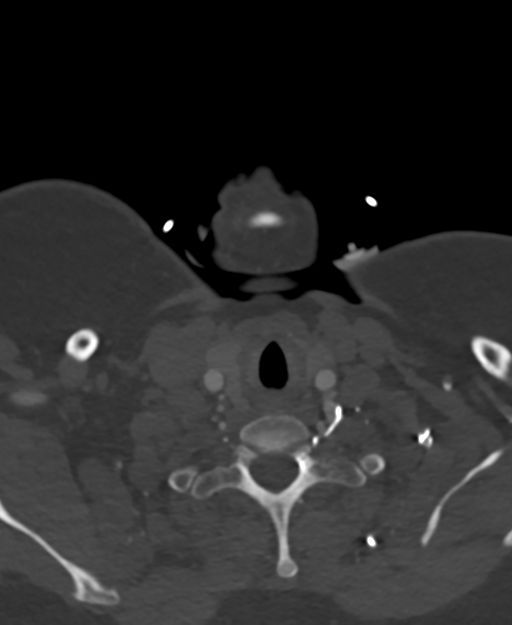
[im 151/353  soft-tissue]
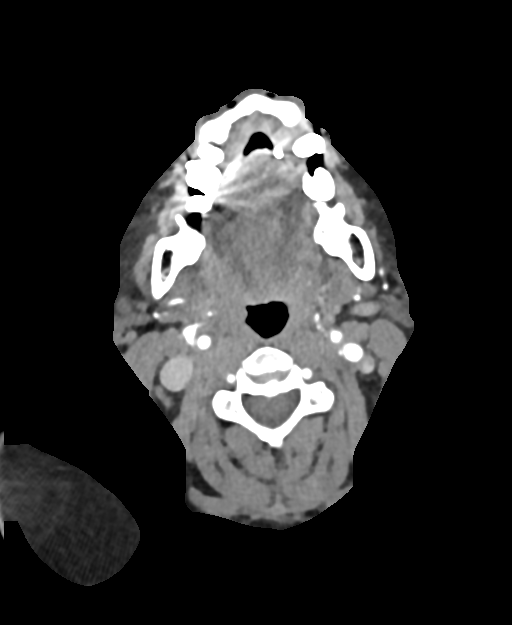
[im 202/353  bone]
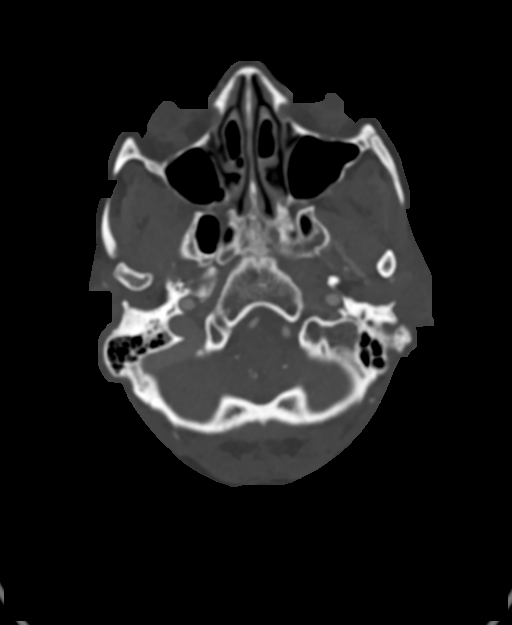
[im 252/353  soft-tissue]
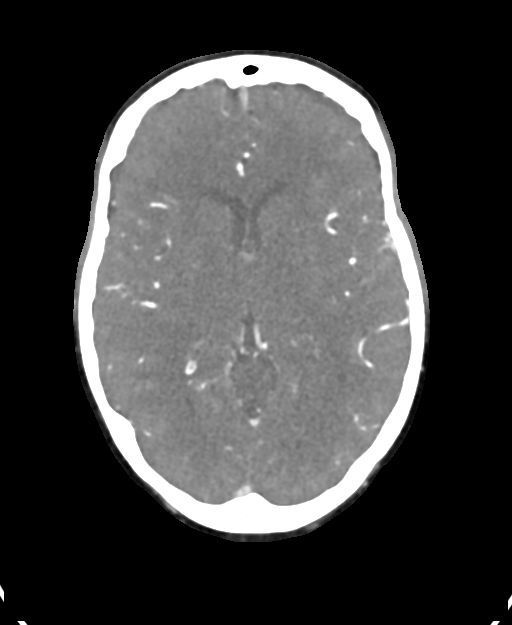
[im 302/353  bone]
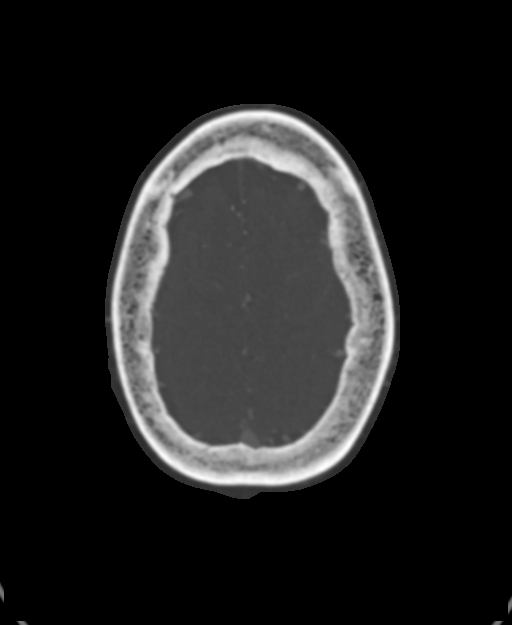

[8 of 33 positions shown; findings below may reference images not displayed]

FINDINGS: CTA NECK FINDINGS

Aortic arch: Visualized aortic arch of normal caliber with normal
branch pattern. Moderate atheromatous change about the arch and
origin of the great vessels without hemodynamically significant
stenosis.

Right carotid system: Right CCA mildly tortuous but widely patent to
the bifurcation without stenosis. Mild eccentric plaque at the
origin of the right ICA without hemodynamically significant
stenosis. Distal right ICA tortuous with mild atheromatous change
without high-grade stenosis.

Left carotid system: Left CCA mildly tortuous but widely patent to
the bifurcation without stenosis. Scattered calcified plaque about
the left bifurcation without hemodynamically significant stenosis.
Left ICA widely patent distally to the skull base without stenosis,
dissection or occlusion.

Vertebral arteries: Both vertebral arteries arise from the
subclavian arteries. Short-segment 30% stenosis at the origin of the
right subclavian artery from the right brachiocephalic artery
(series 7, image 285). No other proximal subclavian stenosis.
Vertebral arteries mildly tortuous proximally but are widely patent
to the skull base without stenosis, dissection or occlusion.

Skeleton: No acute osseous abnormality. No discrete or worrisome
osseous lesions.

Other neck: No other acute soft tissue abnormality within the neck.
No mass lesion or adenopathy.

Upper chest: No abnormality identified within the visualized upper
chest.

Review of the MIP images confirms the above findings

CTA HEAD FINDINGS

Anterior circulation: Petrous segments patent bilaterally. Scattered
atheromatous plaque within the cavernous/supraclinoid ICAs with no
more than mild multifocal stenosis. A1 segments widely patent.
Normal anterior communicating artery complex. Anterior cerebral
arteries widely patent to their distal aspects without stenosis. No
M1 stenosis or occlusion. There is a focal inferior division,
consistent with a small aneurysm (series 8, image 91). This is
directed laterally. Left MCA bifurcation within normal limits.
Distal MCA branches well perfused and symmetric.

Posterior circulation: Vertebral arteries widely patent to the
vertebrobasilar junction without stenosis. Left vertebral artery
dominant. Both picas are patent. Basilar widely patent to its distal
aspect without stenosis. Superior cerebral arteries patent
bilaterally. Both PCAs well perfused to their distal aspects without
stenosis.

Venous sinuses: Grossly patent allowing for timing the contrast
bolus. Left transverse and sigmoid sinuses are markedly hypoplastic.

Anatomic variants: None significant.

Review of the MIP images confirms the above findings
IMPRESSION: 1. Negative CTA for emergent large vessel occlusion. No high-grade
or correctable stenosis identified. No other acute vascular
abnormality.
2. Mild to moderate atheromatous change about the aortic arch,
carotid bifurcations, and carotid siphons without hemodynamically
significant or correctable stenosis.
3. 2 mm right MCA bifurcation aneurysm as above.

## 2022-03-23 ENCOUNTER — Other Ambulatory Visit: Payer: Self-pay | Admitting: Student

## 2022-03-23 DIAGNOSIS — L309 Dermatitis, unspecified: Secondary | ICD-10-CM

## 2022-08-22 ENCOUNTER — Other Ambulatory Visit: Payer: Self-pay | Admitting: Student

## 2022-08-22 DIAGNOSIS — L309 Dermatitis, unspecified: Secondary | ICD-10-CM

## 2022-08-27 ENCOUNTER — Encounter (HOSPITAL_COMMUNITY): Payer: Self-pay

## 2022-08-27 ENCOUNTER — Ambulatory Visit (HOSPITAL_COMMUNITY)
Admission: EM | Admit: 2022-08-27 | Discharge: 2022-08-27 | Disposition: A | Discharge status: Home / Self Care | Payer: BC Managed Care – PPO | Attending: Family Medicine | Admitting: Family Medicine

## 2022-08-27 DIAGNOSIS — Z1152 Encounter for screening for COVID-19: Secondary | ICD-10-CM | POA: Insufficient documentation

## 2022-08-27 DIAGNOSIS — J069 Acute upper respiratory infection, unspecified: Secondary | ICD-10-CM | POA: Insufficient documentation

## 2022-08-27 LAB — POC INFLUENZA A AND B ANTIGEN (URGENT CARE ONLY)
INFLUENZA A ANTIGEN, POC: NEGATIVE
INFLUENZA B ANTIGEN, POC: NEGATIVE

## 2022-08-27 LAB — SARS CORONAVIRUS 2 (TAT 6-24 HRS): SARS Coronavirus 2: NEGATIVE

## 2022-08-27 MED ORDER — ACETAMINOPHEN 325 MG PO TABS
650.0000 mg | ORAL_TABLET | Freq: Once | ORAL | Status: AC
  Administered 2022-08-27: 650 mg via ORAL

## 2022-08-27 MED ORDER — ACETAMINOPHEN 325 MG PO TABS
ORAL_TABLET | ORAL | Status: AC
  Filled 2022-08-27: qty 2

## 2022-08-27 MED ORDER — IBUPROFEN 600 MG PO TABS: 600.0000 mg | ORAL_TABLET | Freq: Three times a day (TID) | ORAL | 0 refills | Status: DC | PRN

## 2022-08-27 MED ORDER — BENZONATATE 100 MG PO CAPS: 100.0000 mg | ORAL_CAPSULE | Freq: Three times a day (TID) | ORAL | 0 refills | Status: DC | PRN

## 2022-08-27 MED ORDER — ACETAMINOPHEN 325 MG PO TABS
ORAL_TABLET | ORAL | Status: AC
Start: 2022-08-27 — End: ?
  Filled 2022-08-27: qty 2

## 2022-08-27 NOTE — ED Triage Notes (Signed)
Pt states headache,cough, body aches and chills for the past 3 days. States she took Tylenol at home.

## 2022-08-27 NOTE — ED Provider Notes (Signed)
[place]    CSN: [id number] Arrival date & time: 08/27/22  0913      History   Chief Complaint Chief Complaint  Patient presents with   Headache    HPI Candice Evans is a 57 y.o. female.    Headache    Here for headache and fever and myalgia.  Cough and rhinorrhea.  Symptoms began on March 24.  She has not had any nausea or vomiting or diarrhea.  Appetite is reduced.  She is exposed to niece who was ill about March 21.  As medical history significant for hypertension and she takes amlodipine and losartan Past Medical History:  Diagnosis Date   Eczema    HTN (hypertension)    Hyperthyroidism     Patient Active Problem List   Diagnosis Date Noted   Healthcare maintenance 10/19/2021   Aneurysm ([place]) 11/26/2019   Stress reaction 07/04/2017   History of cocaine abuse ([place]) 02/18/2013   Dental abscess 08/30/2012   Tobacco abuse counseling 08/26/2012   Hyperthyroidism 07/31/2006   HYPERTENSION, BENIGN SYSTEMIC 07/31/2006   Eczema 07/31/2006    Past Surgical History:  Procedure Laterality Date   CARDIAC CATHETERIZATION     LAD 50% stenosed, no stent    OB History   No obstetric history on file.      Home Medications    Prior to Admission medications   Medication Sig Start Date End Date Taking? Authorizing Provider  benzonatate (TESSALON) 100 MG capsule Take 1 capsule (100 mg total) by mouth 3 (three) times daily as needed for cough. 08/27/22  Yes Barrett Henle, MD  ibuprofen (ADVIL) 600 MG tablet Take 1 tablet (600 mg total) by mouth every 8 (eight) hours as needed (pain). 08/27/22  Yes Barrett Henle, MD  amLODipine (NORVASC) 10 MG tablet Take 1 tablet by mouth once daily 01/21/22   Wells Guiles, DO  hydrochlorothiazide (HYDRODIURIL) 25 MG tablet Take 1 tablet (25 mg total) by mouth daily. Patient not taking: Reported on 07/18/2021 01/25/20   Benay Pike, MD  losartan (COZAAR) 50 MG tablet Take 1 tablet (50 mg total) by mouth at  bedtime. 10/19/21   Wells Guiles, DO  triamcinolone cream (KENALOG) 0.1 % APPLY TO AFFECTED AREA TWICE DAILY 03/25/22   Wells Guiles, DO    Family History Family History  Problem Relation Age of Onset   Hypertension Father    Coronary artery disease Father    Heart failure Father        Died age 25   Heart failure Brother        Died age 16   Hypertension Mother     Social History Social History   Tobacco Use   Smoking status: Former    Packs/day: 0.50    Years: 34.00    Additional pack years: 0.00    Total pack years: 17.00    Types: Cigarettes    Start date: 06/04/1979    Quit date: 2018    Years since quitting: 6.2   Smokeless tobacco: Never   Tobacco comments:    wants to quit, 06-29-2017 per pt she stopped Jan 2017  Vaping Use   Vaping Use: Never used  Substance Use Topics   Alcohol use: No    Alcohol/week: 0.0 standard drinks of alcohol   Drug use: No    Frequency: 0.2 times per week    Types: Marijuana    Comment: former use     Allergies   Bupropion  Review of Systems Review of Systems  Neurological:  Positive for headaches.     Physical Exam Triage Vital Signs ED Triage Vitals  Enc Vitals Group     BP 08/27/22 1047 (!) 177/99     Pulse Rate 08/27/22 1047 (!) 103     Resp 08/27/22 1047 16     Temp 08/27/22 1047 (!) 100.6 F (38.1 C)     Temp Source 08/27/22 1047 Oral     SpO2 08/27/22 1047 94 %     Weight --      Height --      Head Circumference --      Peak Flow --      Pain Score 08/27/22 1048 6     Pain Loc --      Pain Edu? --      Excl. in [place]? --    No data found.  Updated Vital Signs BP (!) 177/99 (BP Location: Left Arm)   Pulse (!) 103   Temp (!) 100.6 F (38.1 C) (Oral)   Resp 16   LMP  (LMP Unknown)   SpO2 94%   Visual Acuity Right Eye Distance:   Left Eye Distance:   Bilateral Distance:    Right Eye Near:   Left Eye Near:    Bilateral Near:     Physical Exam Vitals reviewed.  Constitutional:       General: She is not in acute distress.    Appearance: She is not ill-appearing, toxic-appearing or diaphoretic.  HENT:     Right Ear: Tympanic membrane and ear canal normal.     Left Ear: Tympanic membrane and ear canal normal.     Nose: Congestion present.     Mouth/Throat:     Mouth: Mucous membranes are moist.     Comments: There is no erythema of the oropharynx Eyes:     Extraocular Movements: Extraocular movements intact.     Conjunctiva/sclera: Conjunctivae normal.     Pupils: Pupils are equal, round, and reactive to light.  Cardiovascular:     Rate and Rhythm: Normal rate and regular rhythm.     Heart sounds: No murmur heard. Pulmonary:     Effort: Pulmonary effort is normal. No respiratory distress.     Breath sounds: No stridor. No wheezing, rhonchi or rales.  Musculoskeletal:     Cervical back: Neck supple.  Lymphadenopathy:     Cervical: No cervical adenopathy.  Skin:    Capillary Refill: Capillary refill takes less than 2 seconds.     Coloration: Skin is not jaundiced or pale.  Neurological:     General: No focal deficit present.     Mental Status: She is alert and oriented to person, place, and time.  Psychiatric:        Behavior: Behavior normal.      UC Treatments / Results  Labs (all labs ordered are listed, but only abnormal results are displayed) Labs Reviewed  SARS CORONAVIRUS 2 (TAT 6-24 HRS)  POC INFLUENZA A AND B ANTIGEN (URGENT CARE ONLY)    EKG   Radiology No results found.  Procedures Procedures (including critical care time)  Medications Ordered in UC Medications  acetaminophen (TYLENOL) tablet 650 mg (650 mg Oral Given 08/27/22 1053)    Initial Impression / Assessment and Plan / UC Course  I have reviewed the triage vital signs and the nursing notes.  Pertinent labs & imaging results that were available during my care of the patient were reviewed by me  and considered in my medical decision making (see chart for details).         Test is negative.  COVID swab was done and if positive, she is a candidate for Paxlovid.  Her last EGFR was 105 in 2023. Tessalon Perles are sent in for the cough, and ibuprofen 600 mg is sent in for the headache and myalgia. Final Clinical Impressions(s) / UC Diagnoses   Final diagnoses:  Viral URI     Discharge Instructions      Your flu test was negative.  Take benzonatate 100 mg, 1 tab every 8 hours as needed for cough.  Take ibuprofen 600 mg--1 tab every 8 hours as needed for pain.   You have been swabbed for COVID, and the test will result in the next 24 hours. Our staff will call you if positive. If the COVID test is positive, you should quarantine until you are fever free for 24 hours and you are starting to feel better, and then take added precautions for the next 5 days, such as physical distancing/wearing a mask and good hand hygiene/washing.       ED Prescriptions     Medication Sig Dispense Auth. Provider   benzonatate (TESSALON) 100 MG capsule Take 1 capsule (100 mg total) by mouth 3 (three) times daily as needed for cough. 21 capsule Barrett Henle, MD   ibuprofen (ADVIL) 600 MG tablet Take 1 tablet (600 mg total) by mouth every 8 (eight) hours as needed (pain). 15 tablet Keygan Dumond, Gwenlyn Perking, MD      PDMP not reviewed this encounter.   Barrett Henle, MD 08/27/22 [phone]

## 2022-08-27 NOTE — Discharge Instructions (Signed)
Your flu test was negative.  Take benzonatate 100 mg, 1 tab every 8 hours as needed for cough.  Take ibuprofen 600 mg--1 tab every 8 hours as needed for pain.   You have been swabbed for COVID, and the test will result in the next 24 hours. Our staff will call you if positive. If the COVID test is positive, you should quarantine until you are fever free for 24 hours and you are starting to feel better, and then take added precautions for the next 5 days, such as physical distancing/wearing a mask and good hand hygiene/washing.

## 2022-10-20 ENCOUNTER — Other Ambulatory Visit: Payer: Self-pay | Admitting: Student

## 2022-10-20 DIAGNOSIS — L309 Dermatitis, unspecified: Secondary | ICD-10-CM

## 2022-12-26 ENCOUNTER — Other Ambulatory Visit: Payer: Self-pay | Admitting: Student

## 2022-12-26 DIAGNOSIS — L309 Dermatitis, unspecified: Secondary | ICD-10-CM

## 2023-01-16 ENCOUNTER — Telehealth: Payer: Self-pay

## 2023-01-16 ENCOUNTER — Other Ambulatory Visit: Payer: Self-pay | Admitting: Student

## 2023-01-16 DIAGNOSIS — L309 Dermatitis, unspecified: Secondary | ICD-10-CM

## 2023-01-16 MED ORDER — TRIAMCINOLONE ACETONIDE 0.1 % EX CREA: TOPICAL_CREAM | CUTANEOUS | 0 refills | Status: DC

## 2023-01-16 NOTE — Telephone Encounter (Signed)
Patient calls nurse line requesting refill on Mometasone ointment. Per chart review, medication has been denied multiple times, "refill not appropriate."   Recommended that patient schedule follow up visit to discuss eczema medications and for BP follow up.   Scheduled with Dr. Mliss Sax on 01/24/23.   Patient is asking if she can receive 1 tube of medication to last her until scheduled appointment.   Veronda Prude, RN

## 2023-01-16 NOTE — Telephone Encounter (Signed)
Patient LVM on nurse line.   She reports she requested Mometasone, however she reports the pharmacy informed her Triamcinolone was called in. She does not want this.   I attempted to call her back, however she was at work and could not talk.   Will forward back to PCP.

## 2023-01-16 NOTE — Telephone Encounter (Signed)
Patient returns call to nurse line again.  She reports the Mometasone is for her face. She reports she uses the Triamcinolone on her body. She reports she has plenty of that.   She reports the breakouts on her face are "aggravated" by Triamcinolone.   Please advise.

## 2023-01-17 ENCOUNTER — Other Ambulatory Visit: Payer: Self-pay | Admitting: Student

## 2023-01-17 DIAGNOSIS — L309 Dermatitis, unspecified: Secondary | ICD-10-CM

## 2023-01-17 MED ORDER — MOMETASONE FUROATE 0.1 % EX CREA: 1.0000 | TOPICAL_CREAM | Freq: Every day | CUTANEOUS | 0 refills | Status: DC

## 2023-01-24 ENCOUNTER — Ambulatory Visit (INDEPENDENT_AMBULATORY_CARE_PROVIDER_SITE_OTHER): Payer: BC Managed Care – PPO | Admitting: Family Medicine

## 2023-01-24 ENCOUNTER — Encounter: Payer: Self-pay | Admitting: Family Medicine

## 2023-01-24 VITALS — BP 157/68 | HR 63 | Ht 62.0 in | Wt 162.6 lb

## 2023-01-24 DIAGNOSIS — L309 Dermatitis, unspecified: Secondary | ICD-10-CM | POA: Diagnosis not present

## 2023-01-24 DIAGNOSIS — I10 Essential (primary) hypertension: Secondary | ICD-10-CM | POA: Diagnosis not present

## 2023-01-24 MED ORDER — MOMETASONE FUROATE 0.1 % EX CREA: 1.0000 | TOPICAL_CREAM | Freq: Every day | CUTANEOUS | 0 refills | Status: DC

## 2023-01-24 MED ORDER — TRIAMCINOLONE ACETONIDE 0.1 % EX CREA: TOPICAL_CREAM | CUTANEOUS | 0 refills | Status: AC

## 2023-01-24 MED ORDER — HYDROCHLOROTHIAZIDE 25 MG PO TABS: 12.5000 mg | ORAL_TABLET | Freq: Every day | ORAL | 1 refills | Status: AC

## 2023-01-24 NOTE — Assessment & Plan Note (Addendum)
BP today 157/68.  Patient does have history of 2 mm right MCA bifurcation aneurysm seen on imaging in 2021. Has not followed up on neurosurgery referral due to cost.  The 10-year ASCVD risk score (Arnett DK, et al., 2019) is: 8.5%   Values used to calculate the score:     Age: 57 years     Sex: Female     Diabetic: No     Tobacco smoker: No     Systolic Blood Pressure: 157 mmHg     Is BP treated: Yes     HDL Cholesterol: 64 mg/dL     Total Cholesterol: 165 mg/dL  -Continue amlodipine 10 mg -Start HCTZ 12.5 mg.  I will send this to pharmacy. -May consider starting statin at next visit - Discussed with patient her history of aneurysm identified on imaging. Stressed importance of good BP control in light of this, and patient stated understanding and agreement. Patient feels she is not able to see neurosurgery right now due to cost, but will revisit conversation in future.

## 2023-01-24 NOTE — Progress Notes (Signed)
    SUBJECTIVE:   CHIEF COMPLAINT / HPI:   Recheck blood pressure Blood pressure today 157/68.  At last visit patient was started on losartan 50 mg.  She has continued home amlodipine 10 mg, however has since stopped losartan 50 mg and HCTZ 25 mg herself due to concerns of dizziness and desire to be on fewer medications.  She does report some vision changes and suspects this is due to her amlodipine  She has never had her vision checked at an optometrist.  She reports occasional dizziness.  Denies chest pain, shortness of breath.  Eczema Uses triamcinolone cream on face and mometasone cream on body as needed with eczema flares.  These occur approximately once a month, and are well-managed with creams prescribed.  Requests refill of these today.  PERTINENT  PMH / PSH:  Past Medical History:  Diagnosis Date   Eczema    HTN (hypertension)    Hyperthyroidism    Patient does smoke marijuana occasionally.  Quit smoking cigarettes in 2018.  OBJECTIVE:   BP (!) 157/68   Pulse 63   Ht 5\' 2"  (1.575 m)   Wt 162 lb 9.6 oz (73.8 kg)   LMP  (LMP Unknown)   SpO2 100%   BMI 29.74 kg/m   General: Very pleasant, well-appearing, no acute distress Cardio: Regular rate, regular rhythm, no murmurs on exam. Pulm: Clear, no wheezing, no crackles. No increased work of breathing Abdominal: soft, non-tender, non-distended Extremities: no peripheral edema  Neuro: alert and oriented, speech normal in content, no facial asymmetry, PERRLA. Psych:  Cognition and judgment appear intact. Alert, communicative and cooperative.   ASSESSMENT/PLAN:   HYPERTENSION, BENIGN SYSTEMIC BP today 157/68.  Patient does have history of 2 mm right MCA bifurcation aneurysm seen on imaging in 2021. Has not followed up on neurosurgery referral due to cost.  The 10-year ASCVD risk score (Arnett DK, et al., 2019) is: 8.5%   Values used to calculate the score:     Age: 57 years     Sex: Female     Diabetic: No     Tobacco  smoker: No     Systolic Blood Pressure: 157 mmHg     Is BP treated: Yes     HDL Cholesterol: 64 mg/dL     Total Cholesterol: 165 mg/dL  -Continue amlodipine 10 mg -Start HCTZ 12.5 mg.  I will send this to pharmacy. -May consider starting statin at next visit - Discussed with patient her history of aneurysm identified on imaging. Stressed importance of good BP control in light of this, and patient stated understanding and agreement. Patient feels she is not able to see neurosurgery right now due to cost, but will revisit conversation in future.  Eczema Chronic problem, monthly flares. She uses prescribed creams as needed to treat flares. - per patient request will refill triamcinolone for facial use, mometasone for use on body.   As above, will plan to see patient in follow up in 1-2 weeks for BP recheck. Consider rechecking lab work at that time as it has ben over a year since patient had BMP, TSH, A1c, lipid panel.  Cyndia Skeeters, DO [facility]

## 2023-01-24 NOTE — Patient Instructions (Signed)
Dear Candice Evans  Today we discussed the following concerns and plans:  Eczema: - I will send your refills to your pharmacy  High Blood Pressure: - Continue taking your amlodipine 10 mg daily - Start taking hydrochlorothiazide 12.5 mg daily.  I will send this to your pharmacy. -Consider checking your blood pressure at home and writing down the readings.  Please bring these with you to your next appointment. - I recommend an eye exam with an eye doctor to check your vision  Please follow-up with me in 2 weeks to check your blood pressure.  If you have any concerns, please call the clinic or schedule an appointment.  It was a pleasure to take care of you today. Be well!  Cyndia Skeeters, DO [facility], PGY-1

## 2023-01-24 NOTE — Assessment & Plan Note (Signed)
Chronic problem, monthly flares. She uses prescribed creams as needed to treat flares. - per patient request will refill triamcinolone for facial use, mometasone for use on body.

## 2023-01-26 ENCOUNTER — Other Ambulatory Visit: Payer: Self-pay | Admitting: Student

## 2023-02-07 ENCOUNTER — Encounter: Payer: Self-pay | Admitting: Family Medicine

## 2023-02-07 ENCOUNTER — Ambulatory Visit (INDEPENDENT_AMBULATORY_CARE_PROVIDER_SITE_OTHER): Payer: BC Managed Care – PPO | Admitting: Family Medicine

## 2023-02-07 VITALS — BP 149/61 | HR 60 | Ht 62.0 in | Wt 162.0 lb

## 2023-02-07 DIAGNOSIS — I10 Essential (primary) hypertension: Secondary | ICD-10-CM

## 2023-02-07 DIAGNOSIS — R7303 Prediabetes: Secondary | ICD-10-CM | POA: Diagnosis not present

## 2023-02-07 DIAGNOSIS — Z1211 Encounter for screening for malignant neoplasm of colon: Secondary | ICD-10-CM

## 2023-02-07 DIAGNOSIS — E0590 Thyrotoxicosis, unspecified without thyrotoxic crisis or storm: Secondary | ICD-10-CM | POA: Diagnosis not present

## 2023-02-07 DIAGNOSIS — Z0000 Encounter for general adult medical examination without abnormal findings: Secondary | ICD-10-CM

## 2023-02-07 DIAGNOSIS — Z1231 Encounter for screening mammogram for malignant neoplasm of breast: Secondary | ICD-10-CM

## 2023-02-07 LAB — POCT GLYCOSYLATED HEMOGLOBIN (HGB A1C): Hemoglobin A1C: 5.5 % (ref 4.0–5.6)

## 2023-02-07 NOTE — Assessment & Plan Note (Addendum)
Appears poorly controlled today, however patient was showing nicotine lozenge and reading likely inaccurate.  Difficult to be sure at this point how well patient is BP is controlled.  Does have a known 2 mm right MCA bifurcation aneurysm, hypertension control is a high-priority. -Scheduled with Dr. Raymondo Band for 24-hour BP monitoring -Continue amlodipine 10 mg, HCTZ 12.5 mg -Check BMP

## 2023-02-07 NOTE — Assessment & Plan Note (Addendum)
STD screening: Not interested in screening at this time.  Low risk; no sexual intercourse since 2011. Immunizations: Deferred discussion to next visit. Lipid screening: BMP, lipid panel, A1c obtained today. Colonoscopy: Never done, due.  Referral placed. Mammogram: Never done, due.  Referral placed, contact provided. Pap: Never done at [facility], due.  Deferred to future visit. Advanced Directive: Not discussed due to time.

## 2023-02-07 NOTE — Patient Instructions (Addendum)
It was great to see you today! Thank you for choosing [facility] for your primary care.  Today we addressed:  High Blood Pressure Your BP is moderately controlled. Your goal is to have a BP average <140/90 Medicine Changes: No changes today Homework: Come back in 2 weeks for your BP cuff appointment. Call us if: You start having dizziness  Come back to see [place] in: 2 weeks  You need a mammogram to prevent breast cancer.  Please schedule an appointment.  You can call [phone]. Address below: [address] [place], [place] [address]  Schedule your colonoscopy to help detect colon cancer. The stomach doctors will call to schedule an appt with you. If you decide against this, please ask about the Cologuard as an option.  We are checking some labs today, including thyroid labs, metabolic labs, and lipid panel.  You will get a MyChart message or a letter if results are normal. Otherwise, you will get a call from [place].  If you had a referral placed, they will call you to set up an appointment. Please give [place] a call if you don't hear back in the next 2 weeks.  Thank you for coming to see [place] at [facility] and for the opportunity to care for you! Sharion Dove Jozsef Wescoat, MD 02/07/2023, 9:31 AM

## 2023-02-07 NOTE — Progress Notes (Signed)
   SUBJECTIVE:   CHIEF COMPLAINT / HPI:  Candice Evans is a 57 y.o. female with a pertinent past medical history of poorly controlled HTN, hyperthyroidism, HLD, prediabetes, 2 mm right MCA bifurcation aneurysm seen on imaging in 2021 presenting to the clinic for BP follow up.  Hypertension BP: (!) 149/61 today after recheck. Home medications include: Amlodipine 10 mg, hydrochlorothiazide 12.5 mg (restarted 2 weeks ago). Started on losartan 50 mg in May, stopped this medication and hydrochlorothiazide 25 mg due to dizziness several weeks ago. She endorses taking current medications as prescribed. Does not check blood pressure at home regularly, does have a cuff, not checked with [place]. Diet: Moderate salty processed foods. Exercise: Minimal. Patient has not had a BMP in the past 1 year.  Checking today. Denies chest pain, shortness of breath, vision changes.  Has not had any further dizziness.  Hyperthyroidism Last TSH was within normal limits at 1.44 February 2023.  Denies heart palpitations, sweats, anxiety/agitation.  Has not had any with recent significant weight loss.  Not currently on any medications or treatments.  PERTINENT PMH / PSH: Quit smoking in 2018.  Has 20 pack year history. Works as a Water engineer.   OBJECTIVE:   BP (!) 149/61   Pulse 60   Ht 5\' 2"  (1.575 m)   Wt 162 lb (73.5 kg)   LMP  (LMP Unknown)   SpO2 98%   BMI 29.63 kg/m   General: Age-appropriate, resting comfortably in chair, NAD, alert and at baseline. Eyes: Some watering of R eye, patient says this is normal for her. Neck: Thyroid smooth and without nodules.  No goiter. Cardiovascular: Borderline bradycardic rate, regular rhythm. Normal S1/S2. No murmurs, rubs, or gallops appreciated. 2+ radial pulses. Pulmonary: Clear bilaterally to ascultation. No increased WOB, no accessory muscle usage. No wheezes, crackles, or rhonchi. Skin: Warm and dry.  Some scattered areas of hyperpigmentation on cheeks  and bilateral arms. Extremities: No peripheral edema bilaterally. Capillary refill <2 seconds.   ASSESSMENT/PLAN:   Patient is not up-to-date on healthcare maintenance items, will begin addressing care gaps at visits as able.  Healthcare maintenance STD screening: Not interested in screening at this time.  Low risk; no sexual intercourse since 2011. Immunizations: Deferred discussion to next visit. Lipid screening: BMP, lipid panel, A1c obtained today. Colonoscopy: Never done, due.  Referral placed. Mammogram: Never done, due.  Referral placed, contact provided. Pap: Never done at [facility], due.  Deferred to future visit. Advanced Directive: Not discussed due to time.  HYPERTENSION, BENIGN SYSTEMIC Appears poorly controlled today, however patient was showing nicotine lozenge and reading likely inaccurate.  Difficult to be sure at this point how well patient is BP is controlled. -Scheduled with Dr. Raymondo Band for 24-hour BP monitoring -Continue amlodipine 10 mg, HCTZ 12.5 mg -Check BMP  Hyperthyroidism TSH within normal limits 1 year ago, no concerning symptoms today. -Check TSH  Follow up after 24 hour BP cuff monitoring appointment.  Zaven Klemens Sharion Dove, MD [facility]

## 2023-02-07 NOTE — Assessment & Plan Note (Signed)
TSH within normal limits 1 year ago, no concerning symptoms today. -Check TSH

## 2023-02-08 LAB — BASIC METABOLIC PANEL
BUN/Creatinine Ratio: 20 (ref 9–23)
BUN: 13 mg/dL (ref 6–24)
CO2: 26 mmol/L (ref 20–29)
Calcium: 9.2 mg/dL (ref 8.7–10.2)
Chloride: 97 mmol/L (ref 96–106)
Creatinine, Ser: 0.64 mg/dL (ref 0.57–1.00)
Glucose: 75 mg/dL (ref 70–99)
Potassium: 3.4 mmol/L — ABNORMAL LOW (ref 3.5–5.2)
Sodium: 139 mmol/L (ref 134–144)
eGFR: 102 mL/min/{1.73_m2} (ref >59)

## 2023-02-08 LAB — LIPID PANEL
Chol/HDL Ratio: 3.1 ratio (ref 0.0–4.4)
Cholesterol, Total: 206 mg/dL — ABNORMAL HIGH (ref 100–199)
HDL: 66 mg/dL (ref >39)
LDL Chol Calc (NIH): 124 mg/dL — ABNORMAL HIGH (ref 0–99)
Triglycerides: 92 mg/dL (ref 0–149)
VLDL Cholesterol Cal: 16 mg/dL (ref 5–40)

## 2023-02-08 LAB — TSH RFX ON ABNORMAL TO FREE T4: TSH: 0.924 u[IU]/mL (ref 0.450–4.500)

## 2023-02-10 ENCOUNTER — Telehealth: Payer: Self-pay | Admitting: Family Medicine

## 2023-02-10 DIAGNOSIS — E785 Hyperlipidemia, unspecified: Secondary | ICD-10-CM

## 2023-02-10 MED ORDER — ATORVASTATIN CALCIUM 20 MG PO TABS: 20.0000 mg | ORAL_TABLET | Freq: Every day | ORAL | 3 refills | Status: AC

## 2023-02-10 NOTE — Telephone Encounter (Signed)
Called patient, noted elevated LDL to 124, discussed starting atorvastatin 20 mg and sent prescription to pharmacy.  Patient will also decrease amount of cheese she has been eating.  Noted K 3.4, will not change hydrochlorothiazide or order K supplementation at this time.  Patient does have BP appointment with Dr. Raymondo Band pharmacist on 9/20 for 24 hour BP cuff, will follow up after.  TSH WNL.

## 2023-02-21 ENCOUNTER — Ambulatory Visit: Payer: BC Managed Care – PPO | Admitting: Pharmacist

## 2023-10-07 ENCOUNTER — Ambulatory Visit (HOSPITAL_COMMUNITY): Admission: EM | Admit: 2023-10-07 | Discharge: 2023-10-07 | Disposition: A | Discharge status: Home / Self Care

## 2023-10-07 ENCOUNTER — Encounter (HOSPITAL_COMMUNITY): Payer: Self-pay | Admitting: *Deleted

## 2023-10-07 ENCOUNTER — Ambulatory Visit (INDEPENDENT_AMBULATORY_CARE_PROVIDER_SITE_OTHER)

## 2023-10-07 DIAGNOSIS — G5601 Carpal tunnel syndrome, right upper limb: Secondary | ICD-10-CM

## 2023-10-07 MED ORDER — PREDNISONE 20 MG PO TABS: 40.0000 mg | ORAL_TABLET | Freq: Every day | ORAL | 0 refills | Status: AC

## 2023-10-07 NOTE — ED Triage Notes (Signed)
 Pt states she has right wrist and hand pain. She has decreased ROM, swelling, and pain since yesterday . She states no known injury. She complains of numbness. She took OTC pain reliever and lidocaine patch without relief

## 2023-10-07 NOTE — Discharge Instructions (Signed)
  1. Carpal tunnel syndrome of right wrist (Primary) - DG Wrist Complete Right x-ray performed in UC shows no acute fracture or dislocation of the right wrist. - Apply Wrist brace in UC for compression and immobilization until follow-up with sports medicine - AMB referral to sports medicine for follow-up evaluation of right wrist pain most likely secondary to carpal tunnel syndrome. - predniSONE (DELTASONE) 20 MG tablet; Take 2 tablets (40 mg total) by mouth daily for 5 days.  Dispense: 10 tablet; Refill: 0 -Apply ice 2-3 times a day for 10 to 15 minutes at a time to help with inflammation and pain. -Continue to monitor symptoms for any change in severity if there is any escalation of current symptoms or development of new symptoms follow-up in ER for further evaluation and management.

## 2023-10-07 NOTE — ED Provider Notes (Signed)
 [facility] [place]  Note:  This document was prepared using Dragon voice recognition software and may include unintentional dictation errors.  MRN: [id number] DOB: 29-Apr-1966  Subjective:   Candice Evans is a 58 y.o. female presenting for right wrist pain since Monday morning.  Patient denies any known trauma or severe injury.  Patient reports she has had history with ganglion cyst to the wrist with similar pain.  Patient reports moderately decreased active range of motion with finger extension.  Denies pain with passive extension of fingers.  No significant swelling to the wrist or hand.  Patient states that she has taken Tylenol  with minimal improvement to symptoms.  Patient reports that her job is a lot of repetitive movement with her hands, worked extra shift on Saturday and states no significant symptoms on Sunday but when she woke up on Monday morning had severe pain to wrist and slight numbness to her 3rd and 4th fingers.  No current facility-administered medications for this encounter.  Current Outpatient Medications:    amLODipine  (NORVASC ) 10 MG tablet, Take 1 tablet by mouth once daily, Disp: 90 tablet, Rfl: 3   predniSONE (DELTASONE) 20 MG tablet, Take 2 tablets (40 mg total) by mouth daily for 5 days., Disp: 10 tablet, Rfl: 0   atorvastatin  (LIPITOR) 20 MG tablet, Take 1 tablet (20 mg total) by mouth daily., Disp: 90 tablet, Rfl: 3   hydrochlorothiazide  (HYDRODIURIL ) 25 MG tablet, Take 0.5 tablets (12.5 mg total) by mouth daily., Disp: 30 tablet, Rfl: 1   ibuprofen  (ADVIL ) 600 MG tablet, Take 1 tablet (600 mg total) by mouth every 8 (eight) hours as needed (pain)., Disp: 15 tablet, Rfl: 0   mometasone  (ELOCON ) 0.1 % cream, Apply 1 Application topically daily., Disp: 45 g, Rfl: 0   nicotine  polacrilex (COMMIT) 2 MG lozenge, Take 2 mg by mouth as needed for smoking cessation., Disp: , Rfl:    triamcinolone  cream (KENALOG ) 0.1 %, APPLY TO AFFECTED AREA TWICE DAILY, Disp: 45 g,  Rfl: 0   Allergies  Allergen Reactions   Bupropion Other (See Comments)    Pt said it made her unsympathetic    Past Medical History:  Diagnosis Date   Eczema    HTN (hypertension)    Hyperthyroidism      Past Surgical History:  Procedure Laterality Date   CARDIAC CATHETERIZATION     LAD 50% stenosed, no stent    Family History  Problem Relation Age of Onset   Hypertension Father    Coronary artery disease Father    Heart failure Father        Died age 43   Heart failure Brother        Died age 3   Hypertension Mother     Social History   Tobacco Use   Smoking status: Former    Current packs/day: 0.00    Average packs/day: 0.5 packs/day for 37.0 years (18.5 ttl pk-yrs)    Types: Cigarettes    Start date: 06/04/1979    Quit date: 2018    Years since quitting: 7.3   Smokeless tobacco: Never   Tobacco comments:    wants to quit, 06-29-2017 per pt she stopped Jan 2017  Vaping Use   Vaping status: Never Used  Substance Use Topics   Alcohol use: No    Alcohol/week: 0.0 standard drinks of alcohol   Drug use: No    Frequency: 0.2 times per week    Types: Marijuana    Comment: former use  ROS Refer to HPI for ROS details.  Objective:   Vitals: BP (!) 174/98 (BP Location: Left Arm)   Pulse 64   Temp 98.4 F (36.9 C) (Oral)   Resp 18   LMP  (LMP Unknown)   SpO2 98%   Physical Exam Vitals and nursing note reviewed.  Constitutional:      General: She is not in acute distress.    Appearance: Normal appearance. She is well-developed. She is not ill-appearing or toxic-appearing.  HENT:     Head: Normocephalic and atraumatic.  Cardiovascular:     Rate and Rhythm: Normal rate.  Pulmonary:     Effort: Pulmonary effort is normal. No respiratory distress.  Musculoskeletal:     Right hand: Tenderness and bony tenderness present. No swelling or deformity. Decreased range of motion. Decreased strength. Decreased sensation of the median distribution. Normal  capillary refill. Normal pulse.  Skin:    General: Skin is warm and dry.  Neurological:     General: No focal deficit present.     Mental Status: She is alert and oriented to person, place, and time.  Psychiatric:        Mood and Affect: Mood normal.        Behavior: Behavior normal.     Procedures  No results found for this or any previous visit (from the past 24 hours).  DG Wrist Complete Right Result Date: 10/07/2023 CLINICAL DATA:  Right wrist pain EXAM: RIGHT WRIST - COMPLETE 3+ VIEW COMPARISON:  None Available. FINDINGS: There is no evidence of fracture or dislocation. There is no evidence of arthropathy or other focal bone abnormality. Soft tissues are unremarkable. IMPRESSION: Negative. Electronically Signed   By: Fredrich Jefferson M.D.   On: 10/07/2023 10:51     Assessment and Plan :     Discharge Instructions       1. Carpal tunnel syndrome of right wrist (Primary) - DG Wrist Complete Right x-ray performed in UC shows no acute fracture or dislocation of the right wrist. - Apply Wrist brace in UC for compression and immobilization until follow-up with sports medicine - AMB referral to sports medicine for follow-up evaluation of right wrist pain most likely secondary to carpal tunnel syndrome. - predniSONE (DELTASONE) 20 MG tablet; Take 2 tablets (40 mg total) by mouth daily for 5 days.  Dispense: 10 tablet; Refill: 0 -Apply ice 2-3 times a day for 10 to 15 minutes at a time to help with inflammation and pain. -Continue to monitor symptoms for any change in severity if there is any escalation of current symptoms or development of new symptoms follow-up in ER for further evaluation and management.      Shizuo Biskup B Amiya Escamilla   Curly Mackowski, [place], [place] 10/07/23 1058

## 2023-10-10 ENCOUNTER — Ambulatory Visit (INDEPENDENT_AMBULATORY_CARE_PROVIDER_SITE_OTHER): Admitting: Physician Assistant

## 2023-10-10 DIAGNOSIS — M25531 Pain in right wrist: Secondary | ICD-10-CM | POA: Diagnosis not present

## 2023-10-10 LAB — URIC ACID: Uric Acid, Serum: 5.9 mg/dL (ref 2.5–7.0)

## 2023-10-10 MED ORDER — DICLOFENAC SODIUM 75 MG PO TBEC: DELAYED_RELEASE_TABLET | ORAL | 2 refills | Status: AC

## 2023-10-10 MED ORDER — TRAMADOL HCL 50 MG PO TABS: 50.0000 mg | ORAL_TABLET | Freq: Two times a day (BID) | ORAL | 0 refills | Status: DC | PRN

## 2023-10-10 NOTE — Progress Notes (Signed)
 Office Visit Note   Patient: Candice Evans           Date of Birth: 02/25/1966           MRN: [id number] Visit Date: 10/10/2023              Requested by: Veronia Goon, DO [address] [place],  [place] [address] PCP: Veronia Goon, DO   Assessment & Plan: Visit Diagnoses:  1. Pain in right wrist     Plan: Impression is a chronic right wrist and forearm pain and paresthesias.  Symptoms are somewhat vague.  My suspicion for infection is very low.  I would like to ideally treat her with a longer course of prednisone , however she has a history of hypertension and developed a persistent headache the first 2 days on prednisone .  We have discussed treating this with oral and topical NSAIDs as well as continuation of the wrist splint.  I would like for her to rest and take off of work as there is not a light duty or desk work option and she cannot work with the brace.  Will also obtain a uric acid level today.  Will call her with those results.  Will refer her to Dr. Daisey Dryer for nerve conduction study/EMG.  She will follow-up with us  once that has been completed.  Follow-Up Instructions: Return for f/u after NCS/EMG.   Orders:  Orders Placed This Encounter  Procedures   Uric acid   Ambulatory referral to Physical Medicine Rehab   Meds ordered this encounter  Medications   diclofenac (VOLTAREN) 75 MG EC tablet    Sig: Take one tab po bid x 2 weeks and then as needed    Dispense:  60 tablet    Refill:  2   traMADol  (ULTRAM ) 50 MG tablet    Sig: Take 1 tablet (50 mg total) by mouth 2 (two) times daily as needed.    Dispense:  30 tablet    Refill:  0      Procedures: No procedures performed   Clinical Data: No additional findings.   Subjective: Chief Complaint  Patient presents with   Right Wrist - Pain    HPI patient is a very pleasant 58 year old right-hand-dominant female who comes in today with right wrist pain.  She tells me she has had intermittent pain for a  while which worsened this past week after increasing her work duties.  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  Her pain is primarily along the [facility] joint but radiates into the distal radius and dorsum of the forearm.  Pain appears to be worse when she is extending her fingers.  She was seen in urgent care this past Monday where she was placed in a Velcro splint and put on prednisone .  She developed persistent headaches for the first 2 days and therefore stopped the prednisone .  She does feel as though her symptoms mildly improved after being on the medication and wearing the splint.  She also complains of some paresthesias throughout the entire right hand where she frequently wakes up shaking her hands at night.  This began prior to the worsening of her wrist pain.  Review of Systems as detailed in HPI.  All others reviewed and are negative.   Objective: Vital Signs: LMP  (LMP Unknown)   Physical Exam well-developed well-nourished female in no acute distress.  Alert and oriented x 3.  Ortho Exam right wrist exam:  No swelling.  She has diffuse tenderness along the first dorsal compartment.  Mildly positive Finkelstein test.  She has tenderness along the dorsum of the forearm.  Minimally increased pain with wrist flexion and extension.  She has moderate tenderness along the [facility] joint.  Positive Phalen and positive Tinel.  She is neurovascularly intact distally.  Specialty Comments:  No specialty comments available.  Imaging: No new imaging   PMFS History: Patient Active Problem List   Diagnosis Date Noted   Healthcare maintenance 10/19/2021   Aneurysm (HCC) 11/26/2019   Stress reaction 07/04/2017   History of cocaine abuse (HCC) 02/18/2013   Dental abscess 08/30/2012   Tobacco abuse counseling 08/26/2012   Hyperthyroidism 07/31/2006   HYPERTENSION, BENIGN SYSTEMIC 07/31/2006   Eczema 07/31/2006   Past Medical History:  Diagnosis Date   Eczema    HTN  (hypertension)    Hyperthyroidism     Family History  Problem Relation Age of Onset   Hypertension Father    Coronary artery disease Father    Heart failure Father        Died age 62   Heart failure Brother        Died age 76   Hypertension Mother     Past Surgical History:  Procedure Laterality Date   CARDIAC CATHETERIZATION     LAD 50% stenosed, no stent   Social History   Occupational History   Not on file  Tobacco Use   Smoking status: Former    Current packs/day: 0.00    Average packs/day: 0.5 packs/day for 37.0 years (18.5 ttl pk-yrs)    Types: Cigarettes    Start date: 06/04/1979    Quit date: 2018    Years since quitting: 7.3   Smokeless tobacco: Never   Tobacco comments:    wants to quit, 06-29-2017 per pt she stopped Jan 2017  Vaping Use   Vaping status: Never Used  Substance and Sexual Activity   Alcohol use: No    Alcohol/week: 0.0 standard drinks of alcohol   Drug use: No    Frequency: 0.2 times per week    Types: Marijuana    Comment: former use   Sexual activity: Not Currently

## 2023-10-13 NOTE — Progress Notes (Signed)
 Called and relayed results to patient.

## 2023-10-13 NOTE — Progress Notes (Signed)
 Can you let patient know uric acid (testing for gout) was normal.  Please also let her know that you can have a normal uric acid level and still have gout

## 2023-10-14 ENCOUNTER — Ambulatory Visit (INDEPENDENT_AMBULATORY_CARE_PROVIDER_SITE_OTHER): Admitting: Physical Medicine and Rehabilitation

## 2023-10-14 ENCOUNTER — Encounter: Payer: Self-pay | Admitting: Physical Medicine and Rehabilitation

## 2023-10-14 DIAGNOSIS — M25531 Pain in right wrist: Secondary | ICD-10-CM

## 2023-10-14 DIAGNOSIS — M79631 Pain in right forearm: Secondary | ICD-10-CM | POA: Diagnosis not present

## 2023-10-14 DIAGNOSIS — R202 Paresthesia of skin: Secondary | ICD-10-CM | POA: Diagnosis not present

## 2023-10-14 NOTE — Progress Notes (Signed)
 Candice Evans - 58 y.o. female MRN [id number]  Date of birth: Feb 15, 1966  Office Visit Note: Visit Date: 10/14/2023 PCP: Veronia Goon, DO Referred by: Sandie Cross, PA-C  Subjective: Chief Complaint  Patient presents with   Right Hand - Pain   Right Wrist - Pain   HPI: Candice Evans is a 58 y.o. female who comes in today at the request of Trellis Fries, PA-C for evaluation and management of chronic, worsening and severe pain, numbness and tingling in the Right upper extremities.  Patient is Right hand dominant.  She reports today severe pain and numbness with particular numbness at night and paresthesias where she wakes up.  This is mostly in the right hand and into the wrist.  She reports this has been ongoing off and on for quite a while but worsening over the last few weeks.  Her average pain is only a 2 out of 10 but with certain activities at work and with waking up at night it is quite severe.  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  She is also having some pain around the [facility] joint.  She gets some referral up into the dorsum of the forearm.  She gets some worsening with extension of the fingers.  She has had prednisone  as well as a splint without much relief.  She reports some mild improvement.  Distribution is somewhat global may be more radial distribution.  Again she does have frequent nocturnal complaints.  She also reports a feeling of weakness and decreased range of motion.  Not really any left-sided complaints.  No frank radicular symptoms.  She has also tried Voltaren  as well as tramadol .  No thyroid  disease.  No prior electrodiagnostic study.   I spent more than 30 minutes speaking face-to-face with the patient with 50% of the time in counseling and discussing coordination of care.       Review of Systems  Musculoskeletal:  Positive for joint pain.  Neurological:  Positive for tingling and  weakness.  All other systems reviewed and are negative.  Otherwise per HPI.  Assessment & Plan: Visit Diagnoses:    ICD-10-CM   1. Paresthesia of skin  R20.2 NCV with EMG (electromyography)    2. Pain in right wrist  M25.531     3. Right forearm pain  M79.631        Plan: Impression: Multifactorial right hand pain with paresthesia.  Paresthesia likely carpal tunnel syndrome with some pain from [facility] joint arthritis and other musculotendinous overuse.  Electrodiagnostic study performed today.  The above electrodiagnostic study is ABNORMAL and reveals evidence of a moderate right median nerve entrapment at the wrist (carpal tunnel syndrome) affecting sensory and motor components.   There is no significant electrodiagnostic evidence of any other focal nerve entrapment, brachial plexopathy or cervical radiculopathy.   Recommendations: 1.  Follow-up with referring physician. 2.  Continue current management of symptoms. 3.  Continue use of resting splint at night-time and as needed during the day.  Consider surgical release if continues to be problematic with treatment.  Meds & Orders: No orders of the defined types were placed in this encounter.   Orders Placed This Encounter  Procedures   NCV with EMG (electromyography)    Follow-up: Return for Trellis Fries, MD.   Procedures: No procedures performed  EMG & NCV Findings: Evaluation of the right median motor nerve showed prolonged distal onset  latency (4.5 ms) and decreased conduction velocity (Elbow-Wrist, 49 m/s).  The right median (across palm) sensory nerve showed no response (Palm) and prolonged distal peak latency (4.3 ms).  All remaining nerves (as indicated in the following tables) were within normal limits.    All examined muscles (as indicated in the following table) showed no evidence of electrical instability.    Impression: The above electrodiagnostic study is ABNORMAL and reveals evidence of a moderate right median  nerve entrapment at the wrist (carpal tunnel syndrome) affecting sensory and motor components.   There is no significant electrodiagnostic evidence of any other focal nerve entrapment, brachial plexopathy or cervical radiculopathy.   Recommendations: 1.  Follow-up with referring physician. 2.  Continue current management of symptoms. 3.  Continue use of resting splint at night-time and as needed during the day.  Consider surgical release if continues to be problematic with treatment.  ___________________________ Tsosie Gail Board Certified, American Board of Physical Medicine and Rehabilitation    Nerve Conduction Studies Anti Sensory Summary Table   Stim Site NR Peak (ms) Norm Peak (ms) P-T Amp (V) Norm P-T Amp Site1 Site2 Delta-P (ms) Dist (cm) Vel (m/s) Norm Vel (m/s)  Right Median Acr Palm Anti Sensory (2nd Digit)  30.4C  Wrist    *4.3 <3.6 25.4 >10 Wrist Palm  0.0    Palm *NR  <2.0          Right Radial Anti Sensory (Base 1st Digit)  30.3C  Wrist    2.3 <3.1 28.2  Wrist Base 1st Digit 2.3 0.0    Right Ulnar Anti Sensory (5th Digit)  30.8C  Wrist    3.4 <3.7 15.4 >15.0 Wrist 5th Digit 3.4 14.0 41 >38   Motor Summary Table   Stim Site NR Onset (ms) Norm Onset (ms) O-P Amp (mV) Norm O-P Amp Site1 Site2 Delta-0 (ms) Dist (cm) Vel (m/s) Norm Vel (m/s)  Right Median Motor (Abd Poll Brev)  30.3C  Wrist    *4.5 <4.2 9.3 >5 Elbow Wrist 4.1 20.0 *49 >50  Elbow    8.6  9.1         Right Ulnar Motor (Abd Dig Min)  30.3C  Wrist    2.9 <4.2 10.6 >3 B Elbow Wrist 3.4 19.5 57 >53  B Elbow    6.3  11.3  A Elbow B Elbow 1.0 9.5 95 >53  A Elbow    7.3  11.5          EMG   Side Muscle Nerve Root Ins Act Fibs Psw Amp Dur Poly Recrt Int Deatra Face Comment  Right Abd Poll Brev Median C8-T1 Nml Nml Nml Nml Nml 0 Nml Nml   Right 1stDorInt Ulnar C8-T1 Nml Nml Nml Nml Nml 0 Nml Nml   Right PronatorTeres Median C6-7 Nml Nml Nml Nml Nml 0 Nml Nml   Right Biceps Musculocut C5-6 Nml Nml Nml  Nml Nml 0 Nml Nml   Right Deltoid Axillary C5-6 Nml Nml Nml Nml Nml 0 Nml Nml     Nerve Conduction Studies Anti Sensory Left/Right Comparison   Stim Site L Lat (ms) R Lat (ms) L-R Lat (ms) L Amp (V) R Amp (V) L-R Amp (%) Site1 Site2 L Vel (m/s) R Vel (m/s) L-R Vel (m/s)  Median Acr Palm Anti Sensory (2nd Digit)  30.4C  Wrist  *4.3   25.4  Wrist Palm     Palm             Radial  Anti Sensory (Base 1st Digit)  30.3C  Wrist  2.3   28.2  Wrist Base 1st Digit     Ulnar Anti Sensory (5th Digit)  30.8C  Wrist  3.4   15.4  Wrist 5th Digit  41    Motor Left/Right Comparison   Stim Site L Lat (ms) R Lat (ms) L-R Lat (ms) L Amp (mV) R Amp (mV) L-R Amp (%) Site1 Site2 L Vel (m/s) R Vel (m/s) L-R Vel (m/s)  Median Motor (Abd Poll Brev)  30.3C  Wrist  *4.5   9.3  Elbow Wrist  *49   Elbow  8.6   9.1        Ulnar Motor (Abd Dig Min)  30.3C  Wrist  2.9   10.6  B Elbow Wrist  57   B Elbow  6.3   11.3  A Elbow B Elbow  95   A Elbow  7.3   11.5           Waveforms:            Clinical History: No specialty comments available.   She reports that she quit smoking about 7 years ago. Her smoking use included cigarettes. She started smoking about 44 years ago. She has a 18.5 pack-year smoking history. She has never used smokeless tobacco.  Recent Labs    02/07/23 1010 10/10/23 1429  HGBA1C 5.5  --   LABURIC  --  5.9    Objective:  VS:  HT:    WT:   BMI:     BP:   HR: bpm  TEMP: ( )  RESP:  Physical Exam Vitals and nursing note reviewed.  Constitutional:      General: She is not in acute distress.    Appearance: Normal appearance. She is well-developed. She is not ill-appearing.  HENT:     Head: Normocephalic and atraumatic.  Eyes:     Conjunctiva/sclera: Conjunctivae normal.     Pupils: Pupils are equal, round, and reactive to light.  Cardiovascular:     Rate and Rhythm: Normal rate.     Pulses: Normal pulses.  Pulmonary:     Effort: Pulmonary effort is normal.   Musculoskeletal:        General: No swelling, tenderness or deformity.     Right lower leg: No edema.     Left lower leg: No edema.     Comments: Inspection reveals no atrophy of the bilateral APB or FDI or hand intrinsics. There is no swelling, color changes, allodynia or dystrophic changes. There is 5 out of 5 strength in the bilateral wrist extension, finger abduction and long finger flexion. There is intact sensation to light touch in all dermatomal and peripheral nerve distributions. There is a negative Froment's test bilaterally.  There is a positive Phalen's test on the right. There is a negative Hoffmann's test bilaterally.  Skin:    General: Skin is warm and dry.     Findings: No erythema or rash.  Neurological:     General: No focal deficit present.     Mental Status: She is alert and oriented to person, place, and time.     Sensory: No sensory deficit.     Motor: No weakness or abnormal muscle tone.     Coordination: Coordination normal.     Gait: Gait normal.  Psychiatric:        Mood and Affect: Mood normal.        Behavior: Behavior normal.  Ortho Exam  Imaging: No results found.  Past Medical/Family/Surgical/Social History: Medications & Allergies reviewed per EMR, new medications updated. Patient Active Problem List   Diagnosis Date Noted   Healthcare maintenance 10/19/2021   Aneurysm (HCC) 11/26/2019   Stress reaction 07/04/2017   History of cocaine abuse (HCC) 02/18/2013   Dental abscess 08/30/2012   Tobacco abuse counseling 08/26/2012   Hyperthyroidism 07/31/2006   HYPERTENSION, BENIGN SYSTEMIC 07/31/2006   Eczema 07/31/2006   Past Medical History:  Diagnosis Date   Eczema    HTN (hypertension)    Hyperthyroidism    Family History  Problem Relation Age of Onset   Hypertension Father    Coronary artery disease Father    Heart failure Father        Died age 57   Heart failure Brother        Died age 32   Hypertension Mother    Past Surgical  History:  Procedure Laterality Date   CARDIAC CATHETERIZATION     LAD 50% stenosed, no stent   Social History   Occupational History   Not on file  Tobacco Use   Smoking status: Former    Current packs/day: 0.00    Average packs/day: 0.5 packs/day for 37.0 years (18.5 ttl pk-yrs)    Types: Cigarettes    Start date: 06/04/1979    Quit date: 2018    Years since quitting: 7.3   Smokeless tobacco: Never   Tobacco comments:    wants to quit, 06-29-2017 per pt she stopped Jan 2017  Vaping Use   Vaping status: Never Used  Substance and Sexual Activity   Alcohol use: No    Alcohol/week: 0.0 standard drinks of alcohol   Drug use: No    Frequency: 0.2 times per week    Types: Marijuana    Comment: former use   Sexual activity: Not Currently

## 2023-10-14 NOTE — Procedures (Signed)
 EMG & NCV Findings: Evaluation of the right median motor nerve showed prolonged distal onset latency (4.5 ms) and decreased conduction velocity (Elbow-Wrist, 49 m/s).  The right median (across palm) sensory nerve showed no response (Palm) and prolonged distal peak latency (4.3 ms).  All remaining nerves (as indicated in the following tables) were within normal limits.    All examined muscles (as indicated in the following table) showed no evidence of electrical instability.    Impression: The above electrodiagnostic study is ABNORMAL and reveals evidence of a moderate right median nerve entrapment at the wrist (carpal tunnel syndrome) affecting sensory and motor components.   There is no significant electrodiagnostic evidence of any other focal nerve entrapment, brachial plexopathy or cervical radiculopathy.   Recommendations: 1.  Follow-up with referring physician. 2.  Continue current management of symptoms. 3.  Continue use of resting splint at night-time and as needed during the day.  Consider surgical release if continues to be problematic with treatment.  ___________________________ Tsosie Gail Board Certified, American Board of Physical Medicine and Rehabilitation    Nerve Conduction Studies Anti Sensory Summary Table   Stim Site NR Peak (ms) Norm Peak (ms) P-T Amp (V) Norm P-T Amp Site1 Site2 Delta-P (ms) Dist (cm) Vel (m/s) Norm Vel (m/s)  Right Median Acr Palm Anti Sensory (2nd Digit)  30.4C  Wrist    *4.3 <3.6 25.4 >10 Wrist Palm  0.0    Palm *NR  <2.0          Right Radial Anti Sensory (Base 1st Digit)  30.3C  Wrist    2.3 <3.1 28.2  Wrist Base 1st Digit 2.3 0.0    Right Ulnar Anti Sensory (5th Digit)  30.8C  Wrist    3.4 <3.7 15.4 >15.0 Wrist 5th Digit 3.4 14.0 41 >38   Motor Summary Table   Stim Site NR Onset (ms) Norm Onset (ms) O-P Amp (mV) Norm O-P Amp Site1 Site2 Delta-0 (ms) Dist (cm) Vel (m/s) Norm Vel (m/s)  Right Median Motor (Abd Poll Brev)   30.3C  Wrist    *4.5 <4.2 9.3 >5 Elbow Wrist 4.1 20.0 *49 >50  Elbow    8.6  9.1         Right Ulnar Motor (Abd Dig Min)  30.3C  Wrist    2.9 <4.2 10.6 >3 B Elbow Wrist 3.4 19.5 57 >53  B Elbow    6.3  11.3  A Elbow B Elbow 1.0 9.5 95 >53  A Elbow    7.3  11.5          EMG   Side Muscle Nerve Root Ins Act Fibs Psw Amp Dur Poly Recrt Int Deatra Face Comment  Right Abd Poll Brev Median C8-T1 Nml Nml Nml Nml Nml 0 Nml Nml   Right 1stDorInt Ulnar C8-T1 Nml Nml Nml Nml Nml 0 Nml Nml   Right PronatorTeres Median C6-7 Nml Nml Nml Nml Nml 0 Nml Nml   Right Biceps Musculocut C5-6 Nml Nml Nml Nml Nml 0 Nml Nml   Right Deltoid Axillary C5-6 Nml Nml Nml Nml Nml 0 Nml Nml     Nerve Conduction Studies Anti Sensory Left/Right Comparison   Stim Site L Lat (ms) R Lat (ms) L-R Lat (ms) L Amp (V) R Amp (V) L-R Amp (%) Site1 Site2 L Vel (m/s) R Vel (m/s) L-R Vel (m/s)  Median Acr Palm Anti Sensory (2nd Digit)  30.4C  Wrist  *4.3   25.4  Wrist Palm  Palm             Radial Anti Sensory (Base 1st Digit)  30.3C  Wrist  2.3   28.2  Wrist Base 1st Digit     Ulnar Anti Sensory (5th Digit)  30.8C  Wrist  3.4   15.4  Wrist 5th Digit  41    Motor Left/Right Comparison   Stim Site L Lat (ms) R Lat (ms) L-R Lat (ms) L Amp (mV) R Amp (mV) L-R Amp (%) Site1 Site2 L Vel (m/s) R Vel (m/s) L-R Vel (m/s)  Median Motor (Abd Poll Brev)  30.3C  Wrist  *4.5   9.3  Elbow Wrist  *49   Elbow  8.6   9.1        Ulnar Motor (Abd Dig Min)  30.3C  Wrist  2.9   10.6  B Elbow Wrist  57   B Elbow  6.3   11.3  A Elbow B Elbow  95   A Elbow  7.3   11.5           Waveforms:

## 2023-10-14 NOTE — Progress Notes (Signed)
 Pain Scale   Average Pain 2 Patient advising her pain increases with motion at work. Patient states she is sometimes awaken by tingling in her wright wrist. Patient advised she is Right hand Dominate        +Driver, -BT, -Dye Allergies.

## 2023-10-17 ENCOUNTER — Other Ambulatory Visit: Payer: Self-pay | Admitting: Physician Assistant

## 2023-10-17 NOTE — Telephone Encounter (Signed)
 M25.53 is diagnosis code

## 2023-10-20 ENCOUNTER — Telehealth: Payer: Self-pay | Admitting: Physician Assistant

## 2023-10-20 NOTE — Telephone Encounter (Signed)
 Patient called. She would like a note to be out of work or on light duty. Her wrist is swollen and she can not do her job. Her cb# [phone]

## 2023-10-20 NOTE — Telephone Encounter (Signed)
 Patient called and wants to know if she can go back to work but she need light duty because it hurts when lifting things heavy. CB#[phone]

## 2023-10-20 NOTE — Telephone Encounter (Signed)
 Ok to do for teo weeks.  When does she come back to see us ?

## 2023-10-20 NOTE — Telephone Encounter (Signed)
 Tomorrow. Want to just address the work note tomorrow?

## 2023-10-20 NOTE — Telephone Encounter (Signed)
 Ok for two weeks.  Needs to come back in and see xu to go over emg results and if still in that much pain

## 2023-10-21 ENCOUNTER — Telehealth: Payer: Self-pay | Admitting: Physician Assistant

## 2023-10-21 ENCOUNTER — Ambulatory Visit (INDEPENDENT_AMBULATORY_CARE_PROVIDER_SITE_OTHER): Admitting: Physician Assistant

## 2023-10-21 ENCOUNTER — Encounter: Payer: Self-pay | Admitting: Physician Assistant

## 2023-10-21 DIAGNOSIS — G5601 Carpal tunnel syndrome, right upper limb: Secondary | ICD-10-CM | POA: Diagnosis not present

## 2023-10-21 NOTE — Telephone Encounter (Signed)
 Either way.  We can see what xu says

## 2023-10-21 NOTE — Progress Notes (Signed)
 Office Visit Note   Patient: Candice Evans           Date of Birth: 1966-04-29           MRN: [id number] Visit Date: 10/21/2023              Requested by: Veronia Goon, DO [address] [place],  [place] [address] PCP: Veronia Goon, DO   Assessment & Plan: Visit Diagnoses:  1. Carpal tunnel syndrome, right upper limb     Plan: Impression is resolving right wrist and forearm pain.  I believe she has a component of tendinitis as well as underlying carpal tunnel syndrome.  Patient symptoms have significantly improved with NSAIDs, rest and bracing.  I have offered her a cortisone injection today versus surgical intervention for the carpal tunnel syndrome.  She is not interested in either.  She would like to continue with the NSAIDs and rest for now.  She is applying for short-term disability and would like to be out of work for 4 weeks to help facilitate her continued improvement.  She will follow-up with us  as needed.  Call with concerns or questions.  Follow-Up Instructions: Return if symptoms worsen or fail to improve.   Orders:  No orders of the defined types were placed in this encounter.  No orders of the defined types were placed in this encounter.     Procedures: No procedures performed   Clinical Data: No additional findings.   Subjective: Chief Complaint  Patient presents with   Right Wrist - Follow-up    HPI patient is a pleasant 58 year old female who comes in today for follow-up of her right wrist forearm pain.  She was seen by me about a week and a half ago for this.  She was having pain throughout the dorsum of the forearm which radiated into the distal radius, CMC joint and into the hand.  At the time she was having paresthesias throughout the median nerve distribution.  She had recently tried a steroid pack but had to stop this due to headaches.  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  She was also referred to Dr. Daisey Dryer for nerve conduction study.  She is here today for follow-up.  Her pain has significantly improved but does note if she does too much her symptoms recur.  Recent nerve conduction study shows moderate compression of the median nerve.  Review of Systems as detailed in HPI.  All others and are negative.   Objective: Vital Signs: LMP  (LMP Unknown)   Physical Exam well-developed well-nourished female in no acute distress.  Alert and oriented x 3.  Ortho Exam right upper extremity exam: Very mild tenderness to the extensor tendons.  No pain to the distal radius or into the [facility] joint.  Negative Finkelstein.  She has mild pain with wrist flexion.  Positive Phalen and Tinel at the wrist.  She is neurovascularly intact distally.  Specialty Comments:  No specialty comments available.  Imaging: No new imaging   PMFS History: Patient Active Problem List   Diagnosis Date Noted   Healthcare maintenance 10/19/2021   Aneurysm (HCC) 11/26/2019   Stress reaction 07/04/2017   History of cocaine abuse (HCC) 02/18/2013   Dental abscess 08/30/2012   Tobacco abuse counseling 08/26/2012   Hyperthyroidism 07/31/2006   HYPERTENSION, BENIGN SYSTEMIC 07/31/2006   Eczema 07/31/2006   Past Medical History:  Diagnosis Date   Eczema    HTN (hypertension)    Hyperthyroidism     Family History  Problem Relation Age of Onset   Hypertension Father    Coronary artery disease Father    Heart failure Father        Died age 70   Heart failure Brother        Died age 20   Hypertension Mother     Past Surgical History:  Procedure Laterality Date   CARDIAC CATHETERIZATION     LAD 50% stenosed, no stent   Social History   Occupational History   Not on file  Tobacco Use   Smoking status: Former    Current packs/day: 0.00    Average packs/day: 0.5 packs/day for 37.0 years (18.5 ttl pk-yrs)    Types: Cigarettes    Start date: 06/04/1979    Quit date: 2018     Years since quitting: 7.3   Smokeless tobacco: Never   Tobacco comments:    wants to quit, 06-29-2017 per pt she stopped Jan 2017  Vaping Use   Vaping status: Never Used  Substance and Sexual Activity   Alcohol use: No    Alcohol/week: 0.0 standard drinks of alcohol   Drug use: No    Frequency: 0.2 times per week    Types: Marijuana    Comment: former use   Sexual activity: Not Currently

## 2023-10-21 NOTE — Telephone Encounter (Signed)
 [employer] forms received. Prepay letter/auth mailed to patient.

## 2023-11-11 ENCOUNTER — Encounter: Payer: Self-pay | Admitting: *Deleted

## 2024-01-06 ENCOUNTER — Telehealth: Payer: Self-pay | Admitting: Physician Assistant

## 2024-01-06 NOTE — Telephone Encounter (Signed)
 Pt called stating someone from our office called and states we found her earbuds. Sent teams messages and no one replied. If someone did call this pt about lost earbuds please call pt and if she is unable to answer leave a detailed message to what side the earbuds are on and who they are left with

## 2024-02-16 ENCOUNTER — Other Ambulatory Visit: Payer: Self-pay

## 2024-02-16 MED ORDER — AMLODIPINE BESYLATE 10 MG PO TABS: 10.0000 mg | ORAL_TABLET | Freq: Every day | ORAL | 3 refills | Status: DC

## 2024-02-16 NOTE — Telephone Encounter (Signed)
 Seeing patient next week for BP assessment. Cont chronic HTN medication at this time. Refill provided

## 2024-02-23 ENCOUNTER — Ambulatory Visit (INDEPENDENT_AMBULATORY_CARE_PROVIDER_SITE_OTHER)

## 2024-02-23 VITALS — BP 149/85 | HR 64 | Ht 62.0 in | Wt 157.8 lb

## 2024-02-23 DIAGNOSIS — L309 Dermatitis, unspecified: Secondary | ICD-10-CM | POA: Diagnosis not present

## 2024-02-23 DIAGNOSIS — G5601 Carpal tunnel syndrome, right upper limb: Secondary | ICD-10-CM | POA: Diagnosis not present

## 2024-02-23 DIAGNOSIS — E785 Hyperlipidemia, unspecified: Secondary | ICD-10-CM

## 2024-02-23 DIAGNOSIS — I10 Essential (primary) hypertension: Secondary | ICD-10-CM

## 2024-02-23 DIAGNOSIS — Z0000 Encounter for general adult medical examination without abnormal findings: Secondary | ICD-10-CM

## 2024-02-23 MED ORDER — MOMETASONE FUROATE 0.1 % EX CREA: 1.0000 | TOPICAL_CREAM | Freq: Every day | CUTANEOUS | 0 refills | Status: AC

## 2024-02-23 MED ORDER — LOSARTAN POTASSIUM 25 MG PO TABS: 25.0000 mg | ORAL_TABLET | Freq: Every day | ORAL | 0 refills | Status: AC

## 2024-02-23 MED ORDER — DICLOFENAC SODIUM 1 % EX GEL: 2.0000 g | Freq: Four times a day (QID) | CUTANEOUS | 1 refills | Status: AC | PRN

## 2024-02-23 NOTE — Patient Instructions (Addendum)
 Carpal tunnel syndrome - let's try topical voltaren /diclofenac  gel up to four times per day.   Blood pressure - diet changes doing great! Keep taking the amlodipine . Stop hydrochlorothiazide . Start losartan  25 mg once a day.  Check your blood pressure once a week or more if able at home. Write this down and bring it back to the next visit.  Labs today- I will call you if these are abnormal.   Best, Dr. Alena

## 2024-02-23 NOTE — Assessment & Plan Note (Signed)
 BP recheck 149/85. Goal <130/80. Not well controlled. Continue amlodipine  10 mg daily and start losartan  25 mg every day.  She stopped taking hydrochlorothiazide  and statin (below) as she felt it was hurting her kidneys. Discussed that HTN is leading cause of kidney failure in [place] . Encouraged her to take her blood pressure at home every other day and bring a log to her next visit. She thinks once a week is realistic for her. BMP today.  She will follow up with Dr. Donzetta in 2 weeks for BP recheck, medication titration as appropriate. Please reschedule appointment with me in January for repeat discussion of screening tests/pap.

## 2024-02-23 NOTE — Assessment & Plan Note (Signed)
 She uses mometasone  on occasion on her face and triamcinolone  on occasion on the rest of her body. Refilled mometasone  today.

## 2024-02-23 NOTE — Progress Notes (Signed)
    SUBJECTIVE:   CHIEF COMPLAINT / HPI: HTN, right wrist pain/tingling  HTN Feels her blood pressure is very good today. Taking amlodipine  10 mg daily  Not taking hydrochlorothiazide  12.5 mg daily for BP because it makes her dizzy and sluggish. Has BP cuff at home but has not been checking it.  Right wrist Diagnosed with carpal tunnel at the ortho clinic Taking diclofenac  PRN with benefit.  Not interested in injection or surgery. Wearing cock-up wrist splint. Her work is repetitive hand motions which exacerbates her wrist symptoms/   Colonoscopy: did not get done Mammogram: did not get done Pap: has not had done in the past 20 years Not taking atorvastatin     PERTINENT  PMH / PSH: HTN, HLD  OBJECTIVE:   BP (!) 149/85   Pulse 64   Ht 5' 2 (1.575 m)   Wt 157 lb 12.8 oz (71.6 kg)   LMP  (LMP Unknown)   SpO2 96%   BMI 28.86 kg/m    General: well appearing CV: RRR, no m/r/g Pulm: CTAB  ASSESSMENT/PLAN:   Assessment & Plan Essential hypertension, benign BP recheck 149/85. Goal <130/80. Not well controlled. Continue amlodipine  10 mg daily and start losartan  25 mg every day.  She stopped taking hydrochlorothiazide  and statin (below) as she felt it was hurting her kidneys. Discussed that HTN is leading cause of kidney failure in [place] . Encouraged her to take her blood pressure at home every other day and bring a log to her next visit. She thinks once a week is realistic for her. BMP today.  She will follow up with Dr. Donzetta in 2 weeks for BP recheck, medication titration as appropriate. Please reschedule appointment with me in January for repeat discussion of screening tests/pap. Eczema, unspecified type She uses mometasone  on occasion on her face and triamcinolone  on occasion on the rest of her body. Refilled mometasone  today. Hyperlipidemia, unspecified hyperlipidemia type She has made dietary changes. Not taking statin as she feels this is hurting her kidneys.  We  discussed indications of this medication and risk/benefit of taking/not taking. Recheck lipid panel today and follow risk. Carpal tunnel syndrome of right wrist Previously seen by orthopedics. Not interested in injection/surgery. We will try topical voltaren  gel. Continue wrist splinting. She is thinking about finding another job to relieve her symptoms Routine adult health maintenance Colonoscopy: discussed risk/benefit of screening for colon cancer. Colonoscopy is gold standard. She politely declined. Offered cologuard. She will consider this.  Mammogram: discussed risk/benefit of screening for breast cancer. She will call her insurance to inquire about cost. Pap: last pap 20 years ago. Deferred today.   We will rediscuss the above at her next visit with me.    Candice Bera Alena Morrison, MD [facility]

## 2024-02-24 LAB — BASIC METABOLIC PANEL WITH GFR
BUN/Creatinine Ratio: 21 (ref 9–23)
BUN: 11 mg/dL (ref 6–24)
CO2: 24 mmol/L (ref 20–29)
Calcium: 9.2 mg/dL (ref 8.7–10.2)
Chloride: 102 mmol/L (ref 96–106)
Creatinine, Ser: 0.53 mg/dL — ABNORMAL LOW (ref 0.57–1.00)
Glucose: 87 mg/dL (ref 70–99)
Potassium: 3.7 mmol/L (ref 3.5–5.2)
Sodium: 139 mmol/L (ref 134–144)
eGFR: 106 mL/min/1.73 (ref >59)

## 2024-02-24 LAB — LIPID PANEL
Chol/HDL Ratio: 3.1 ratio (ref 0.0–4.4)
Cholesterol, Total: 196 mg/dL (ref 100–199)
HDL: 64 mg/dL (ref >39)
LDL Chol Calc (NIH): 120 mg/dL — ABNORMAL HIGH (ref 0–99)
Triglycerides: 65 mg/dL (ref 0–149)
VLDL Cholesterol Cal: 12 mg/dL (ref 5–40)

## 2024-02-26 ENCOUNTER — Ambulatory Visit: Payer: Self-pay

## 2024-03-08 ENCOUNTER — Encounter: Payer: Self-pay | Admitting: Family Medicine

## 2024-03-08 ENCOUNTER — Ambulatory Visit (INDEPENDENT_AMBULATORY_CARE_PROVIDER_SITE_OTHER): Admitting: Family Medicine

## 2024-03-08 VITALS — BP 142/78 | HR 60 | Ht 62.0 in | Wt 160.4 lb

## 2024-03-08 DIAGNOSIS — I10 Essential (primary) hypertension: Secondary | ICD-10-CM | POA: Diagnosis not present

## 2024-03-08 DIAGNOSIS — E785 Hyperlipidemia, unspecified: Secondary | ICD-10-CM

## 2024-03-08 NOTE — Patient Instructions (Addendum)
 It was wonderful to see you today.  Please bring ALL of your medications with you to every visit.   Today we talked about:  - For your blood pressure, we rechecked your kidney function today. Keep taking the losartan  daily.   Thank you for choosing [facility].   Please call [phone] with any questions about today's appointment.  Please arrive at least 15 minutes prior to your scheduled appointments.   If you had blood work today, I will send you a MyChart message or a letter if results are normal. Otherwise, I will give you a call.   If you had a referral placed, they will call you to set up an appointment. Please give us  a call if you don't hear back in the next 2 weeks.   If you need additional refills before your next appointment, please call your pharmacy first.  Don't forget to check out the [facility] in [facility] at [address] [phone] Affordable prices on prescriptions and over-the-counter items, as well as services like vaccinations and medication home delivery.   Rollene Keeling, MD  Family Medicine

## 2024-03-08 NOTE — Progress Notes (Signed)
    SUBJECTIVE:   CHIEF COMPLAINT / HPI:   HTN- taking losartan  about 3x a week, makes her feel tired. Otherwise no side effects and feels great. Last took yesterday. Is willing to try taking every day, does not want to switch to a different medication. Taking amlodipine  10mg  daily without side effects.  HLD- recent lipid panel LDL 120. Discussed starting statin therapy, she prefers to work on dietary changes including cutting back on red meat and cheese.   Declines pap smear, mammogram, and colonoscopy.  OBJECTIVE:   BP (!) 142/78   Pulse 60   Ht 5' 2 (1.575 m)   Wt 160 lb 6.4 oz (72.8 kg)   LMP  (LMP Unknown)   SpO2 100%   BMI 29.34 kg/m   General: A&O, NAD HEENT: No sign of trauma, EOM grossly intact Cardiac: RRR, no m/r/g Respiratory: CTAB, normal WOB, no w/c/r GI: Soft, NTTP, non-distended  Extremities: NTTP, no peripheral edema. Neuro: Normal gait, moves all four extremities appropriately. Psych: Appropriate mood and affect   ASSESSMENT/PLAN:   Assessment & Plan Essential hypertension, benign Repeat BP not at goal, recommend taking losartan  25mg  daily instead of several times a week Keep checking BP at home BMP today Discussed importance of BP control to prevent kidney disease, CVA, heart disease Hyperlipidemia, unspecified hyperlipidemia type Recommended high intensity statin, prefers dietary changes at this time F/u with PCP in 1 month Did discuss importance of controlling BP and cholesterol to prevent kidney disease, heart attack, stroke.     Candice FORBES Keeling, MD [facility]

## 2024-03-08 NOTE — Assessment & Plan Note (Signed)
 Repeat BP not at goal, recommend taking losartan  25mg  daily instead of several times a week Keep checking BP at home BMP today Discussed importance of BP control to prevent kidney disease, CVA, heart disease

## 2024-03-09 ENCOUNTER — Ambulatory Visit: Payer: Self-pay | Admitting: Family Medicine

## 2024-03-09 LAB — BASIC METABOLIC PANEL WITH GFR
BUN/Creatinine Ratio: 15 (ref 9–23)
BUN: 9 mg/dL (ref 6–24)
CO2: 21 mmol/L (ref 20–29)
Calcium: 9.1 mg/dL (ref 8.7–10.2)
Chloride: 102 mmol/L (ref 96–106)
Creatinine, Ser: 0.6 mg/dL (ref 0.57–1.00)
Glucose: 80 mg/dL (ref 70–99)
Potassium: 4.3 mmol/L (ref 3.5–5.2)
Sodium: 139 mmol/L (ref 134–144)
eGFR: 103 mL/min/1.73 (ref >59)

## 2024-03-23 ENCOUNTER — Ambulatory Visit: Payer: Self-pay | Admitting: Student

## 2024-04-05 ENCOUNTER — Encounter: Payer: Self-pay | Admitting: Radiology

## 2024-04-19 ENCOUNTER — Telehealth: Payer: Self-pay

## 2024-04-19 MED ORDER — AMLODIPINE BESYLATE 10 MG PO TABS: 10.0000 mg | ORAL_TABLET | Freq: Every day | ORAL | 2 refills | Status: AC

## 2024-04-19 NOTE — Telephone Encounter (Signed)
 Patient calls nurse line regarding issues with getting her amlodipine .   She reports that her insurance prefers her to use a mail order pharmacy. She is unsure which mail order is affiliated with her insurance.   She is asking that prescription be sent to [facility] for now, until she is able to obtain this information.   Advised that rx was previously sent to [facility]. Patient reports that she had transferred to [facility], however, they are more expensive. She would like for rx to be sent back to [facility].   Cancelled prescription at [facility] and resent to [facility].   Patient will call back once she knows what mail order pharmacy is covered by her insurance.   Chiquita JAYSON English, RN
